# Patient Record
Sex: Male | Born: 1950
Health system: Southern US, Community
[De-identification: ages and names within clinical notes are randomized; demographics above are authoritative.]

## PROBLEM LIST (undated history)

## (undated) DIAGNOSIS — I4891 Unspecified atrial fibrillation: Secondary | ICD-10-CM

## (undated) DIAGNOSIS — R519 Headache, unspecified: Secondary | ICD-10-CM

## (undated) DIAGNOSIS — R51 Headache: Secondary | ICD-10-CM

## (undated) DIAGNOSIS — I639 Cerebral infarction, unspecified: Secondary | ICD-10-CM

## (undated) HISTORY — DX: Cerebral infarction, unspecified: I63.9

## (undated) HISTORY — PX: TONSILLECTOMY: SHX5217

## (undated) HISTORY — DX: Headache, unspecified: R51.9

## (undated) HISTORY — PX: FACIAL FRACTURE SURGERY: SHX1570

## (undated) HISTORY — DX: Unspecified atrial fibrillation: I48.91

## (undated) HISTORY — DX: Headache: R51

---

## 2001-05-25 ENCOUNTER — Encounter: Admission: RE | Admit: 2001-05-25 | Discharge: 2001-05-26 | Payer: Self-pay | Admitting: Anesthesiology

## 2002-05-22 ENCOUNTER — Encounter: Admission: RE | Admit: 2002-05-22 | Discharge: 2002-08-14 | Payer: Self-pay | Admitting: Family Medicine

## 2004-03-21 ENCOUNTER — Emergency Department (HOSPITAL_COMMUNITY): Admission: EM | Admit: 2004-03-21 | Discharge: 2004-03-21 | Payer: Self-pay | Admitting: Emergency Medicine

## 2004-03-21 IMAGING — CT CT ANGIO CHEST
4 of 7 series · 16 of 31 positions shown · IV contrast (omnipaque)
Comparison: none

CLINICAL DATA: 53-year-old male, chest pain and shortness of breath.   
CT ANGIOGRAM OF THE CHEST WITH CONTRAST ? PULMONARY EMBOLUS PROTOCOL
TECHNIQUE: Standard CTA protocol is performed with 120 cc Omnipaque 300 contrast.  Coronal and sagittal multiplanar reformatted imaging were included. 
No comparisons.
The heart size is normal.  No pericardial or pleural fluid.  There are prominent but nonenlarged axillary, mediastinal, and hilar lymph nodes.  The pulmonary arterial vasculature opacified demonstrates no evidence of definite filling defect to suggest thromboembolic disease to the chest.  Specifically, the pulmonary outflow tract, main pulmonary arteries, and segmental branches are patent.  There is limited visualization of the distal segmental and subsegmental branches because of the contrast bolus.  Incidental azygos lobe is noted in the right upper chest, normal variant.  Mucous secretions are thought to be present along the trachea on the right side, images 17 through 25.  
Lung windows demonstrate two subcentimeter noncalcified nodules in the left lower lobe along the fissure. These warrant followup in 3 to 6 months.  Ill-defined patchy density is evident in the lingula and left lower lobe consistent with atelectasis and/or developing airspace disease.  Early pneumonia is not excluded.  Dependent atelectasis is evident in the right lower lobe.  No lobar consolidation.  
IMPRESSION
No definite evidence of pulmonary embolus by CT criteria with limited visualization in the distal branches as described because of contrast bolus. 
Prominent mediastinal lymph nodes but not definitely enlarged by CT criteria.  
Left lower lobe pulmonary nodules warrant followup in 3 to 6 months. 
Lingula and left lower lobe patchy ill-defined density consistent with atelectasis and/or airspace disease. 
Right lower lobe atelectasis.

[Series 4: pe w/ lower ext · axial · 0.70mm/px · z∈[-290,-88]mm · 8 of 218 slices shown]
[im 28/218  lung]
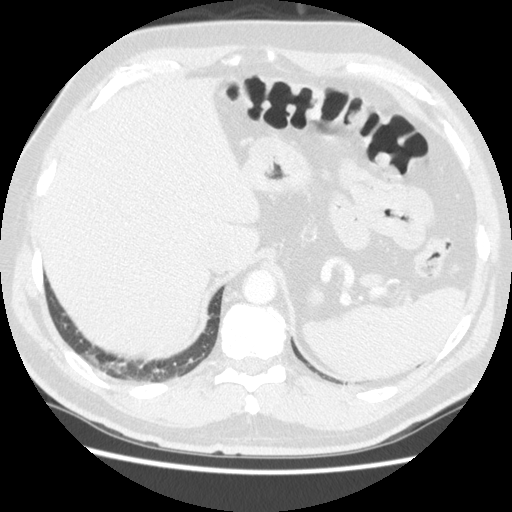
[im 55/218  mediastinal]
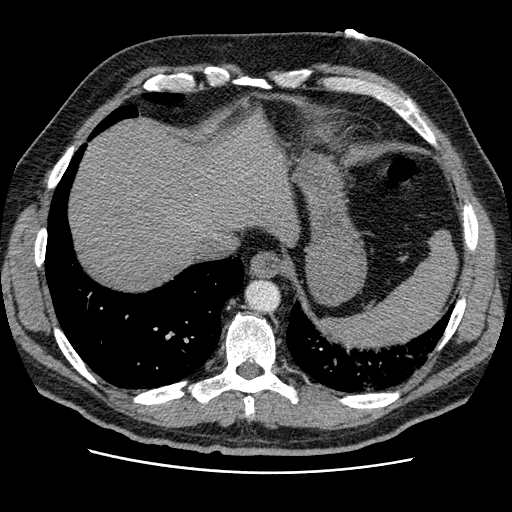
[im 82/218  lung]
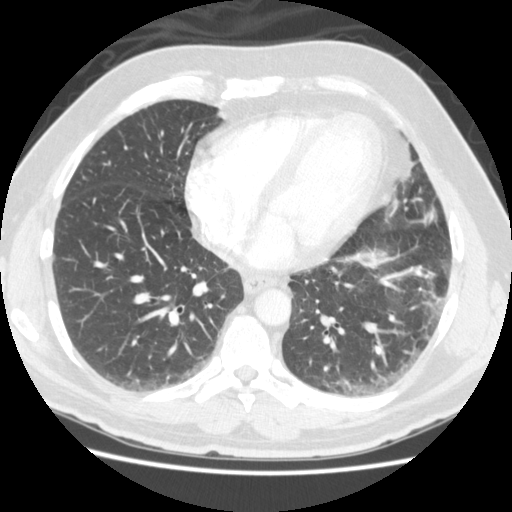
[im 109/218  mediastinal]
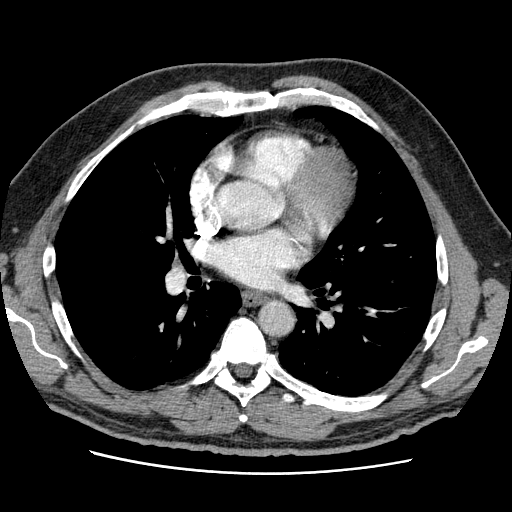
[im 120/218  lung]
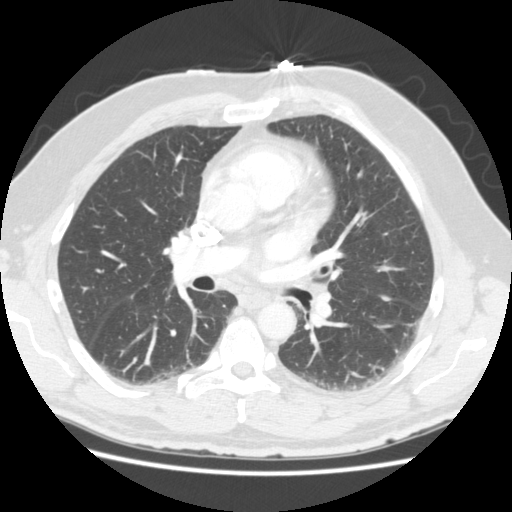
[im 136/218  mediastinal]
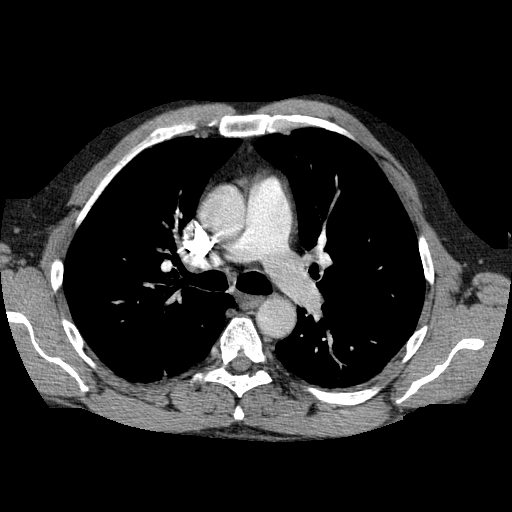
[im 163/218  lung]
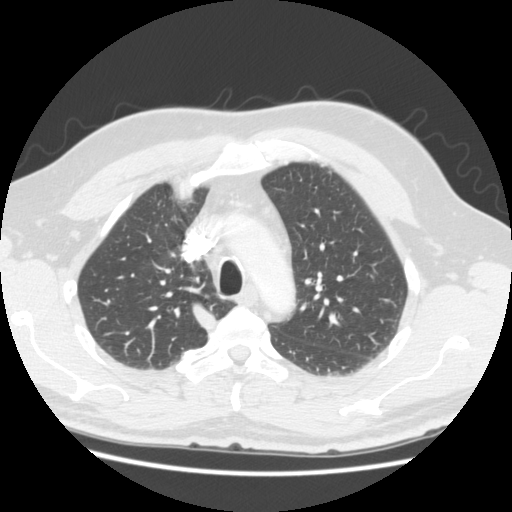
[im 190/218  mediastinal]
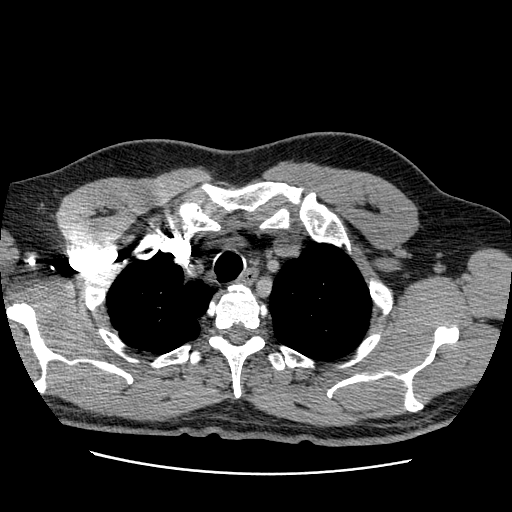

[Series 5: recon 2: pe w/ lower ext · axial · 0.70mm/px · z∈[-233,-143]mm · 3 of 109 slices shown]
[im 37/109  lung]
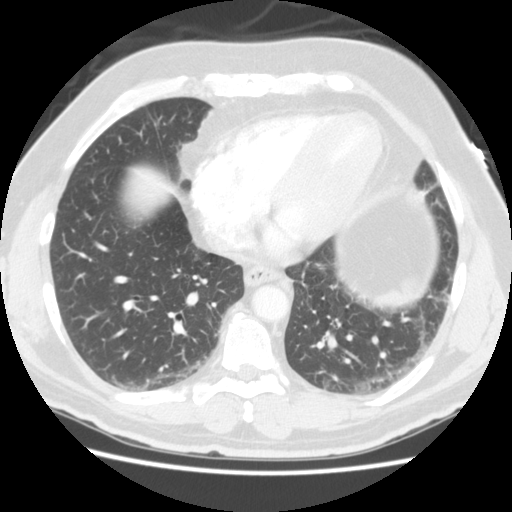
[im 60/109  lung]
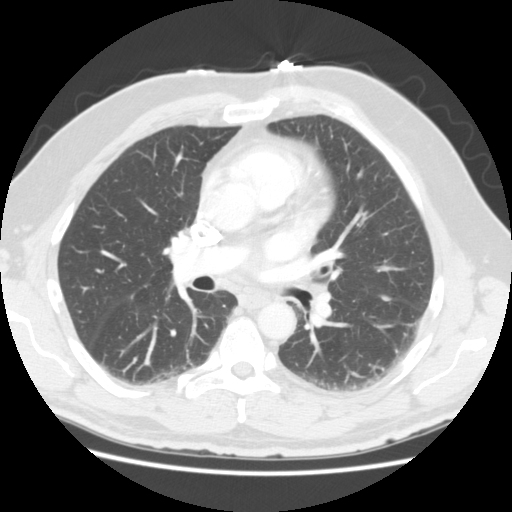
[im 73/109  lung]
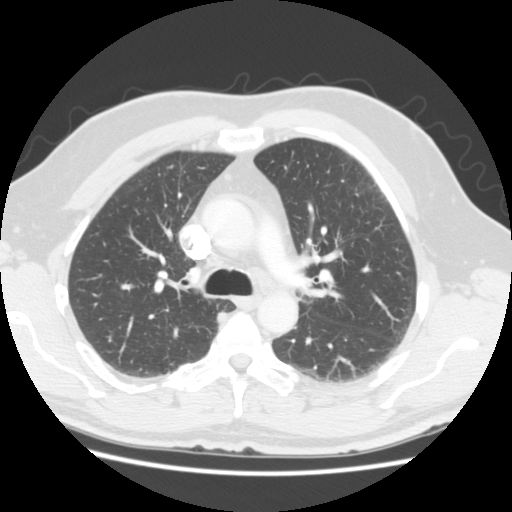

[Series 400: reformatted · sagittal · 0.70mm/px · 3 of 101 slices shown (1 of 2)]
[im 26/101  lung]
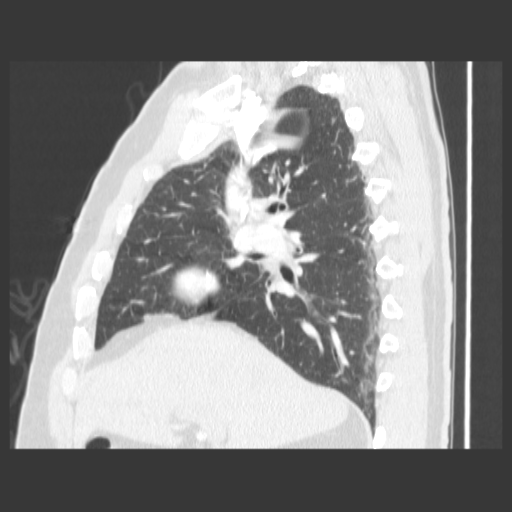
[im 51/101  lung]
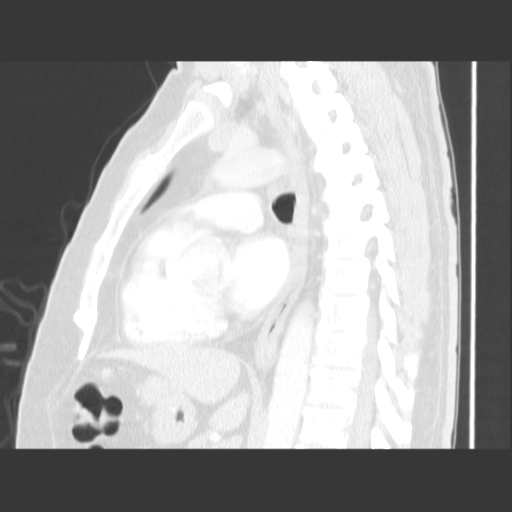
[im 76/101  lung]
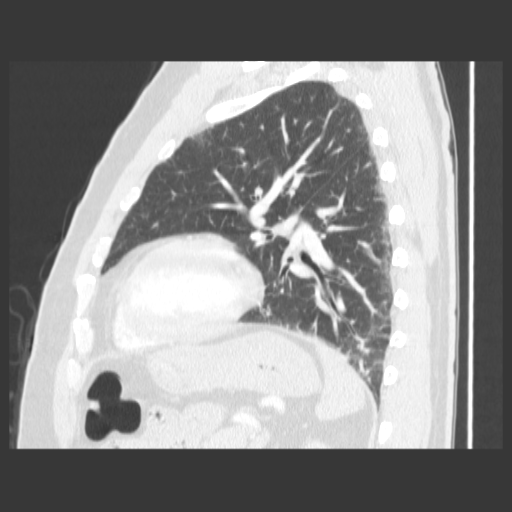

[Series 401: reformatted · coronal · 0.70mm/px · 2 of 80 slices shown (2 of 2)]
[im 27/80  lung]
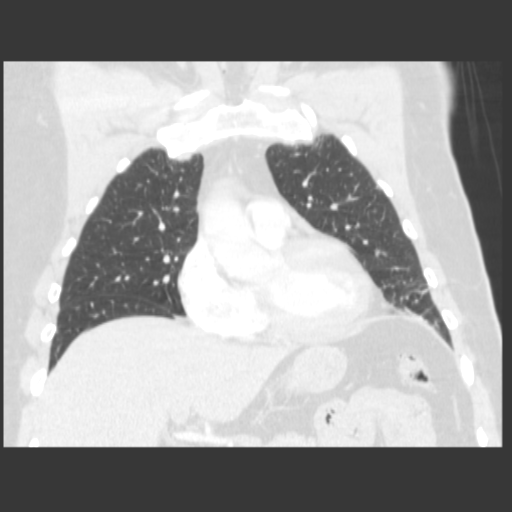
[im 53/80  lung]
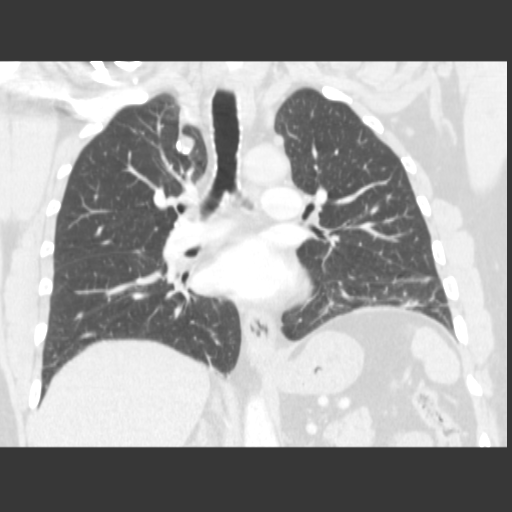

[16 of 31 positions shown; findings below may reference images not displayed]

## 2007-03-30 ENCOUNTER — Emergency Department (HOSPITAL_COMMUNITY): Admission: EM | Admit: 2007-03-30 | Discharge: 2007-03-30 | Payer: Self-pay | Admitting: Emergency Medicine

## 2007-03-30 IMAGING — CT CT HEAD W/O CM
3 of 4 series · 16 of 47 positions shown, 19 images · IV contrast (agent unspecified)
Comparison: none

CLINICAL DATA: Headache for a month.  Sinus pressure and drainage.
 HEAD CT WITHOUT CONTRAST:
TECHNIQUE: Contiguous axial images were obtained from the base of the skull through the vertex according to standard protocol without contrast.
TECHNIQUE: Coronal and axial CT images were obtained through the maxillofacial region including the facial bones, orbits, and paranasal sinuses.  No intravenous contrast was administered.

[Series 6: facial 2.0 h30s st · axial · 0.37mm/px · z∈[-282,-124]mm · 10 of 89 slices shown, 13 images]
[im 5/89  brain]
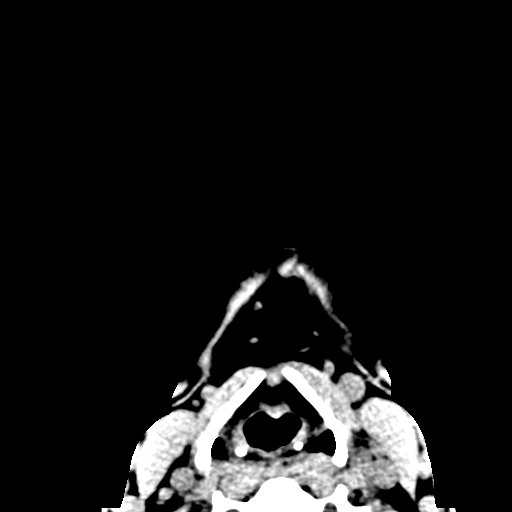
[im 5/89  bone]
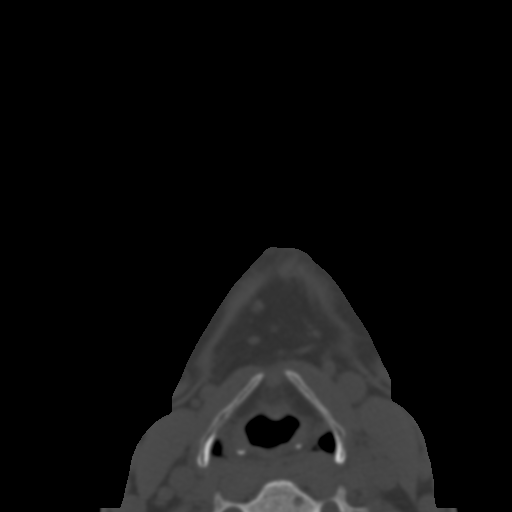
[im 14/89  brain]
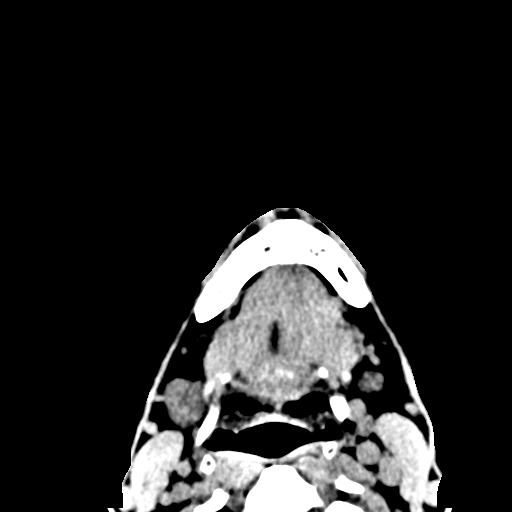
[im 23/89  brain]
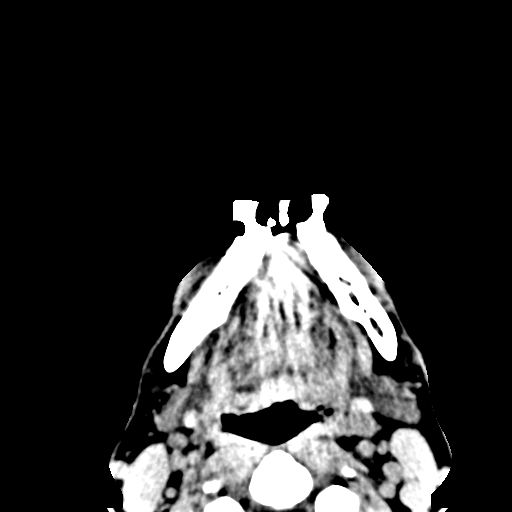
[im 31/89  brain]
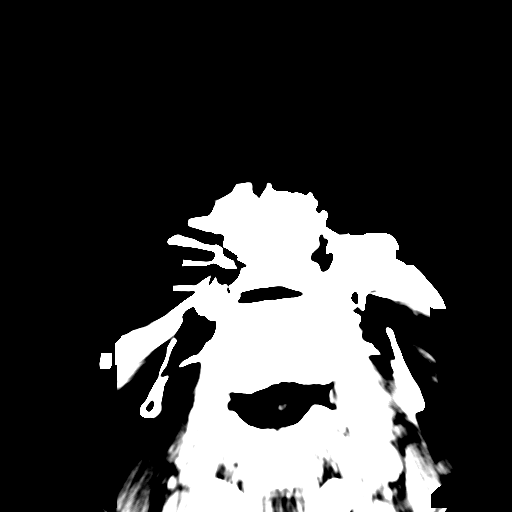
[im 40/89  brain]
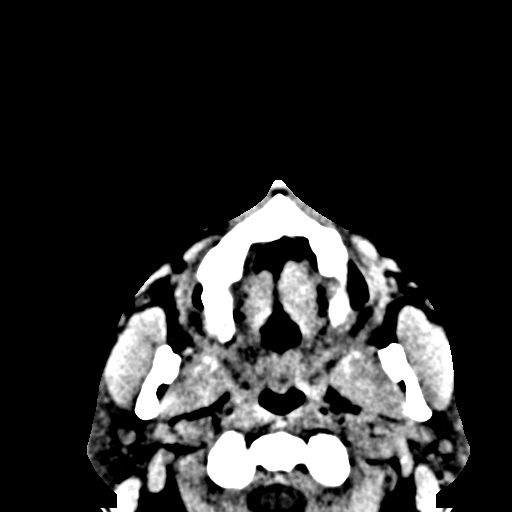
[im 40/89  bone]
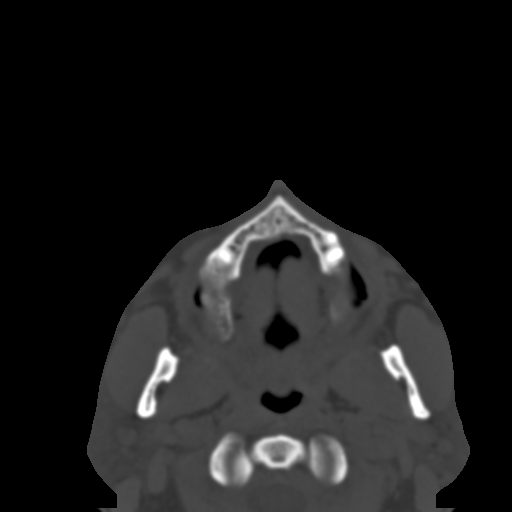
[im 49/89  brain]
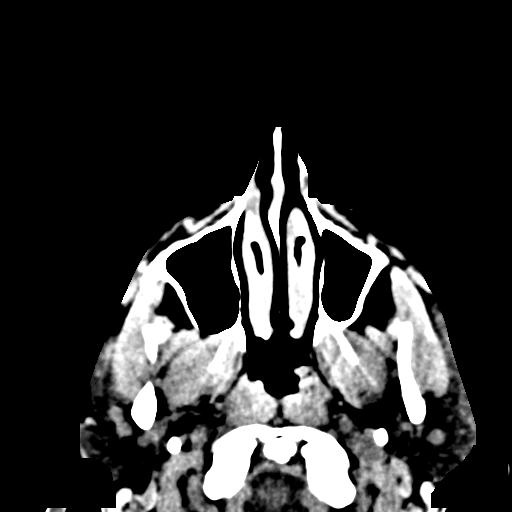
[im 58/89  brain]
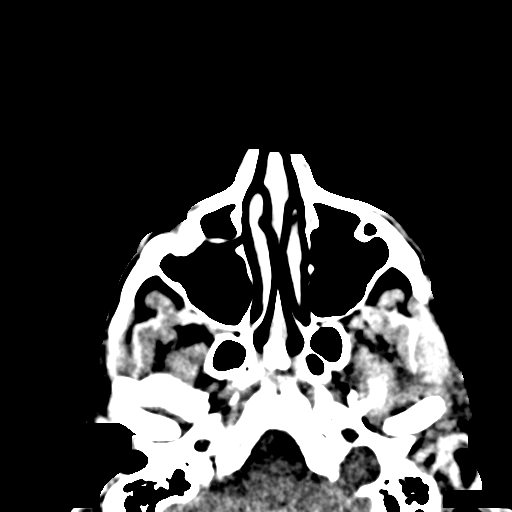
[im 67/89  brain]
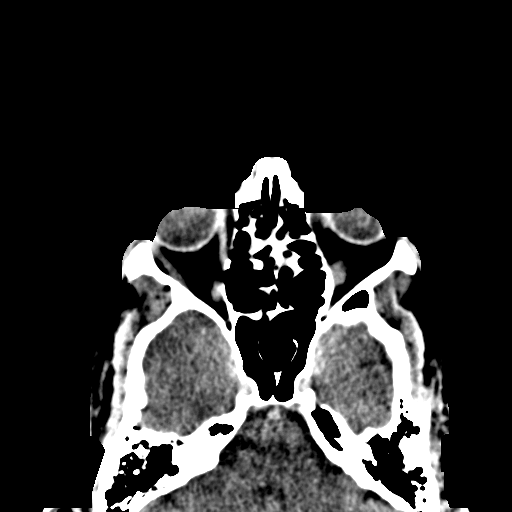
[im 75/89  brain]
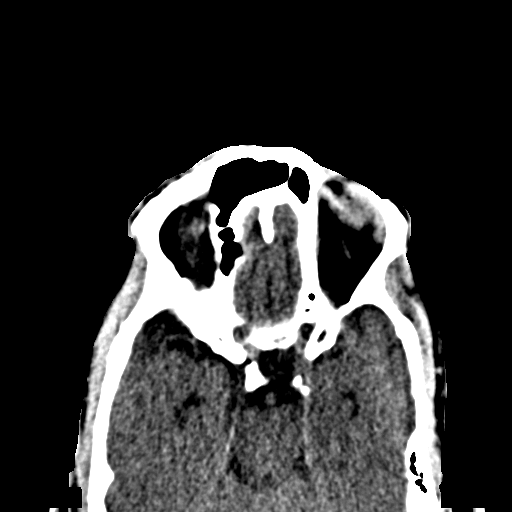
[im 75/89  bone]
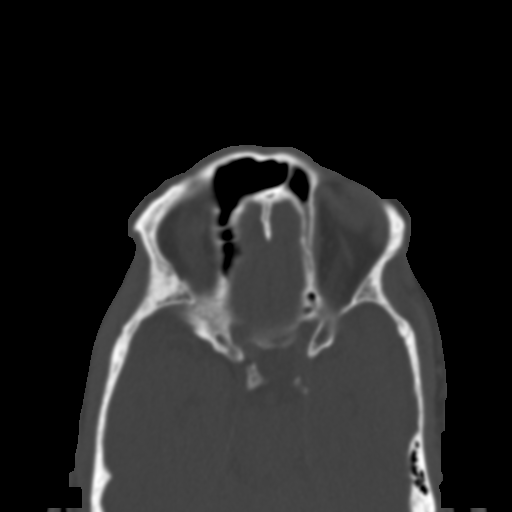
[im 84/89  brain]
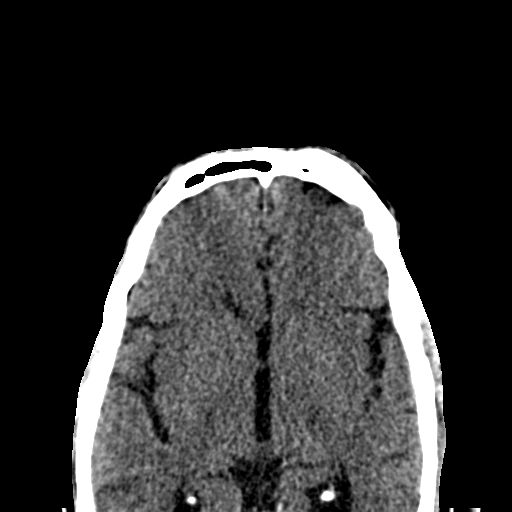

[Series 7: facial 2.0 spo · coronal · 0.39mm/px · 3 of 66 slices shown]
[im 22/66  brain]
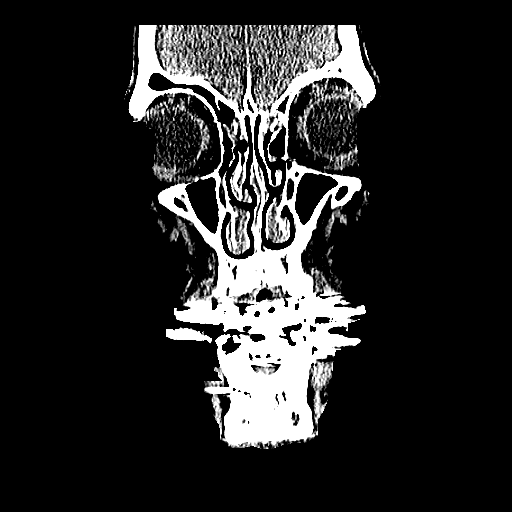
[im 29/66  brain]
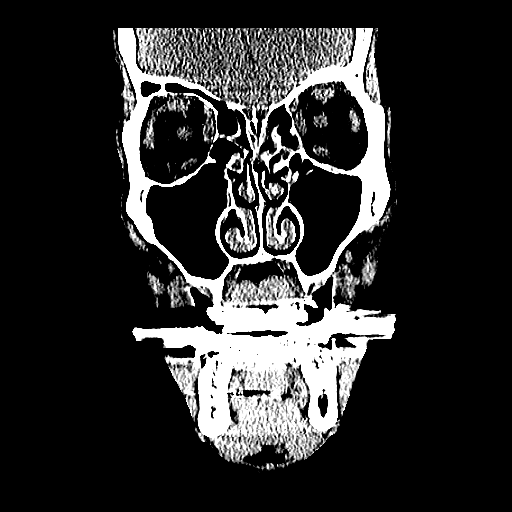
[im 37/66  brain]
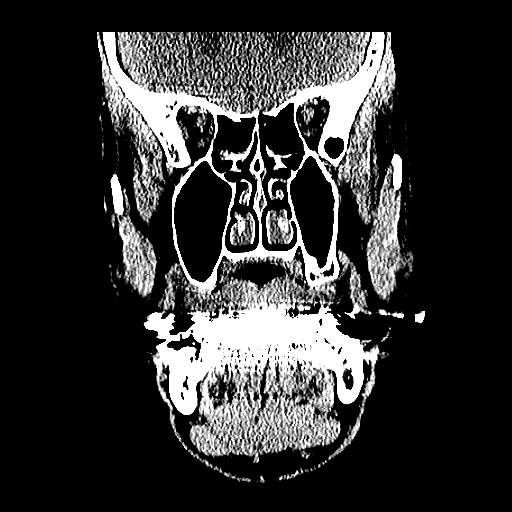

[Series 8: facial 2.0 spo sag sag sag · sagittal · 0.39mm/px · 3 of 80 slices shown]
[im 27/80  brain]
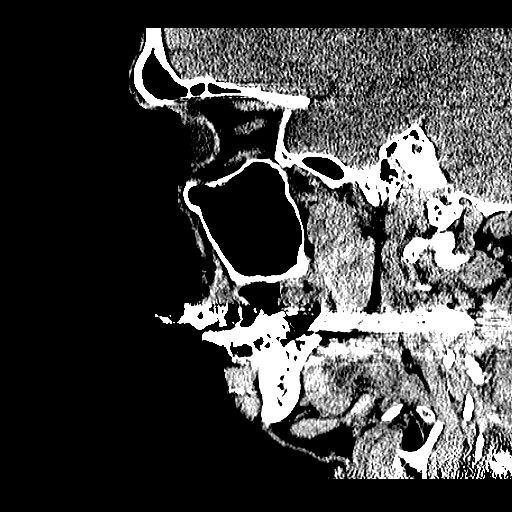
[im 40/80  brain]
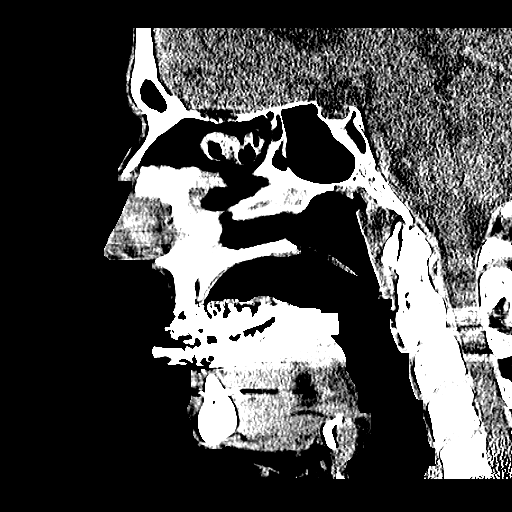
[im 53/80  brain]
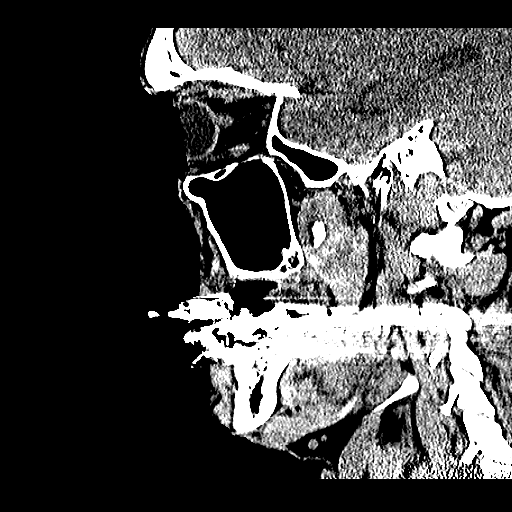

[16 of 47 positions shown; findings below may reference images not displayed]

FINDINGS: The brain has a normal appearance without evidence of atrophy, stroke, mass, hemorrhage, hydrocephalus, or extra-axial collection.  The calvarium is unremarkable.
IMPRESSION: Negative head CT.
 MAXILLOFACIAL CT WITHOUT CONTRAST:
FINDINGS: The maxillary sinuses are clear.  A few of the ethmoid air cells are opacified but this is not widespread.  The sphenoid sinus is clear.  The frontal sinuses are clear.  No fluid in the mastoids or middle ears. The other facial bones appear unremarkable.
IMPRESSION: Very minimal inflammatory change in the ethmoid region. This could be subclinical.  Certainly this is not the picture of pronounced sinusitis.

## 2007-03-30 IMAGING — CR DG CHEST 2V
2 series · 2 of 2 positions shown · non-contrast
Comparison: None

CLINICAL DATA: Headache, atrial fib

CHEST - 2 VIEW:

[w chest pa]
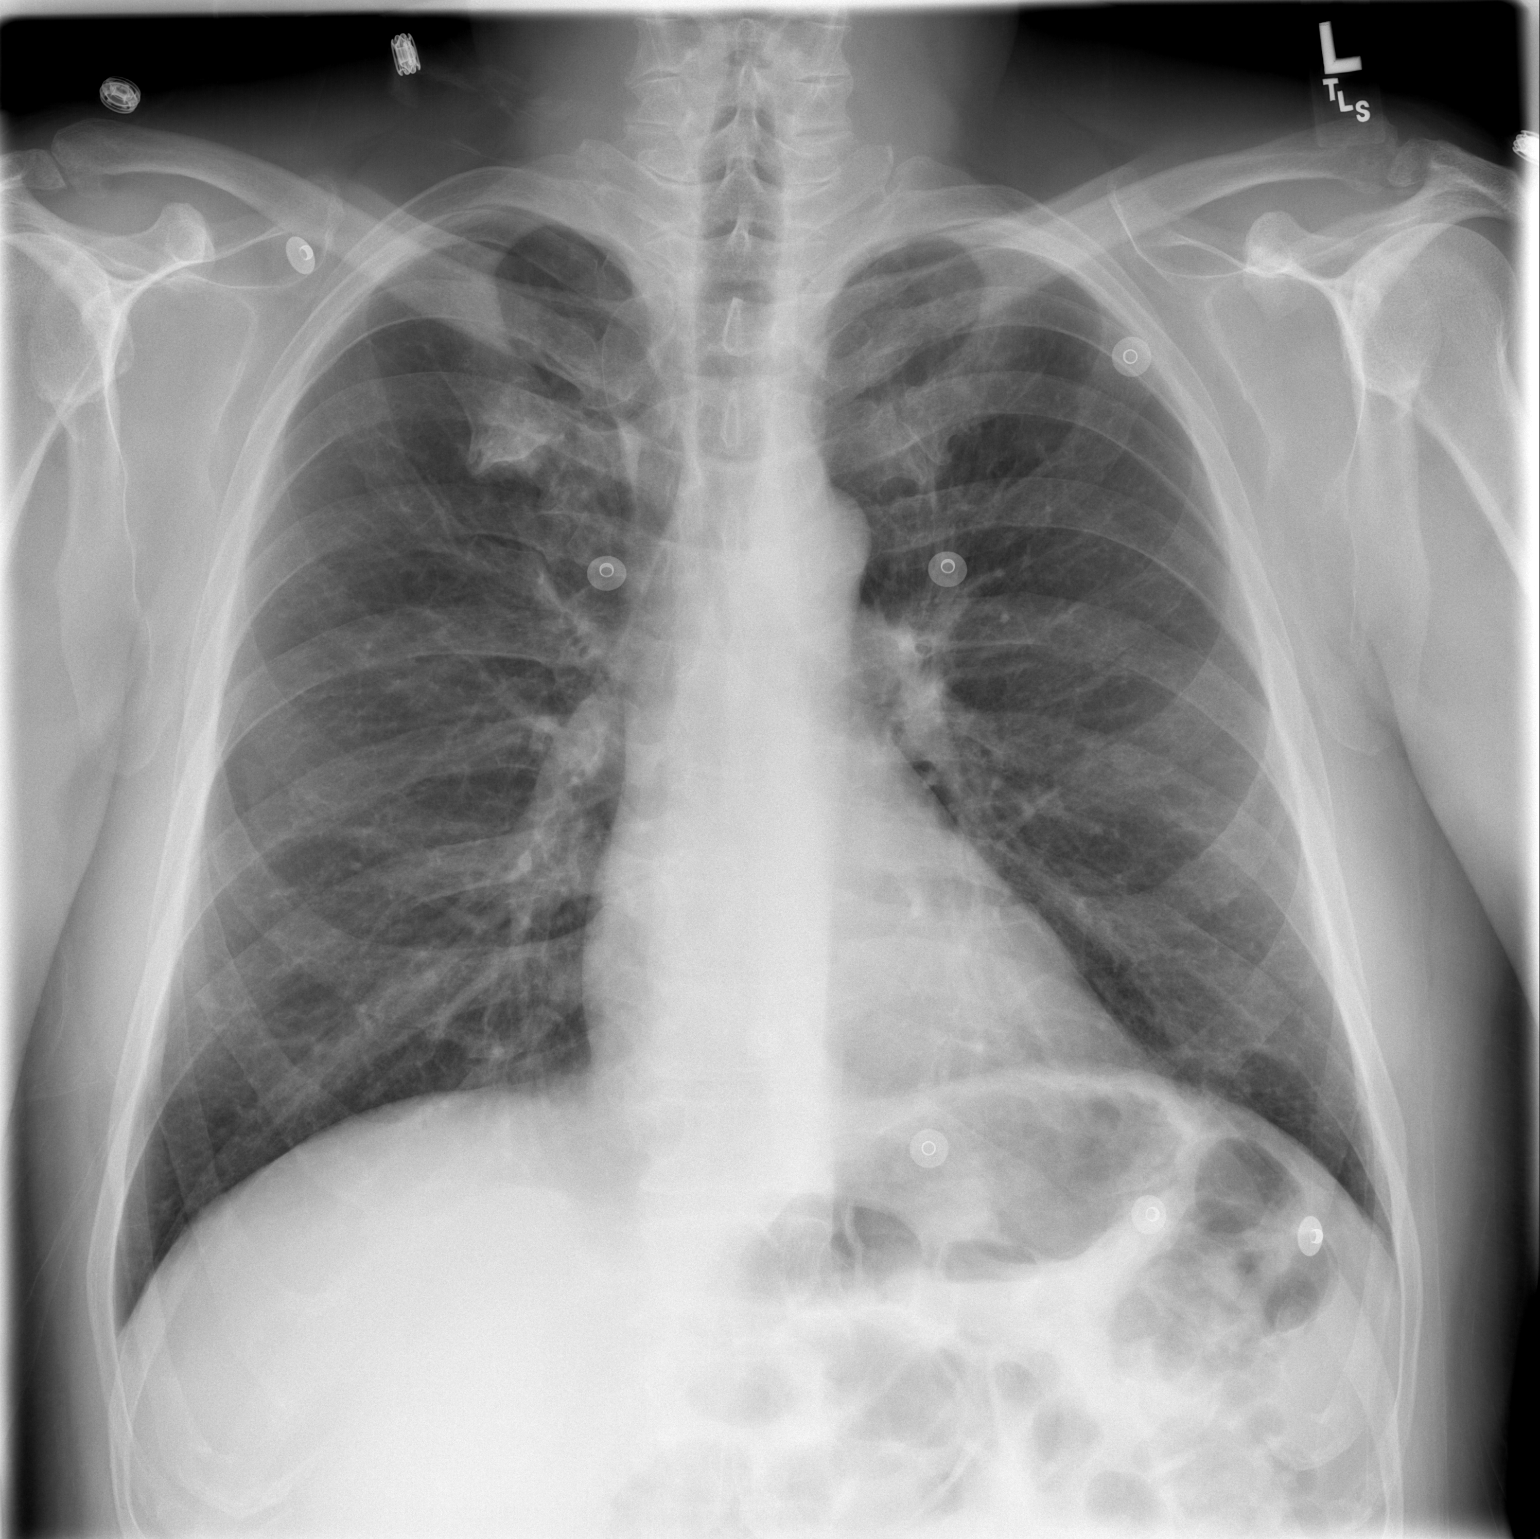

[w chest lat]
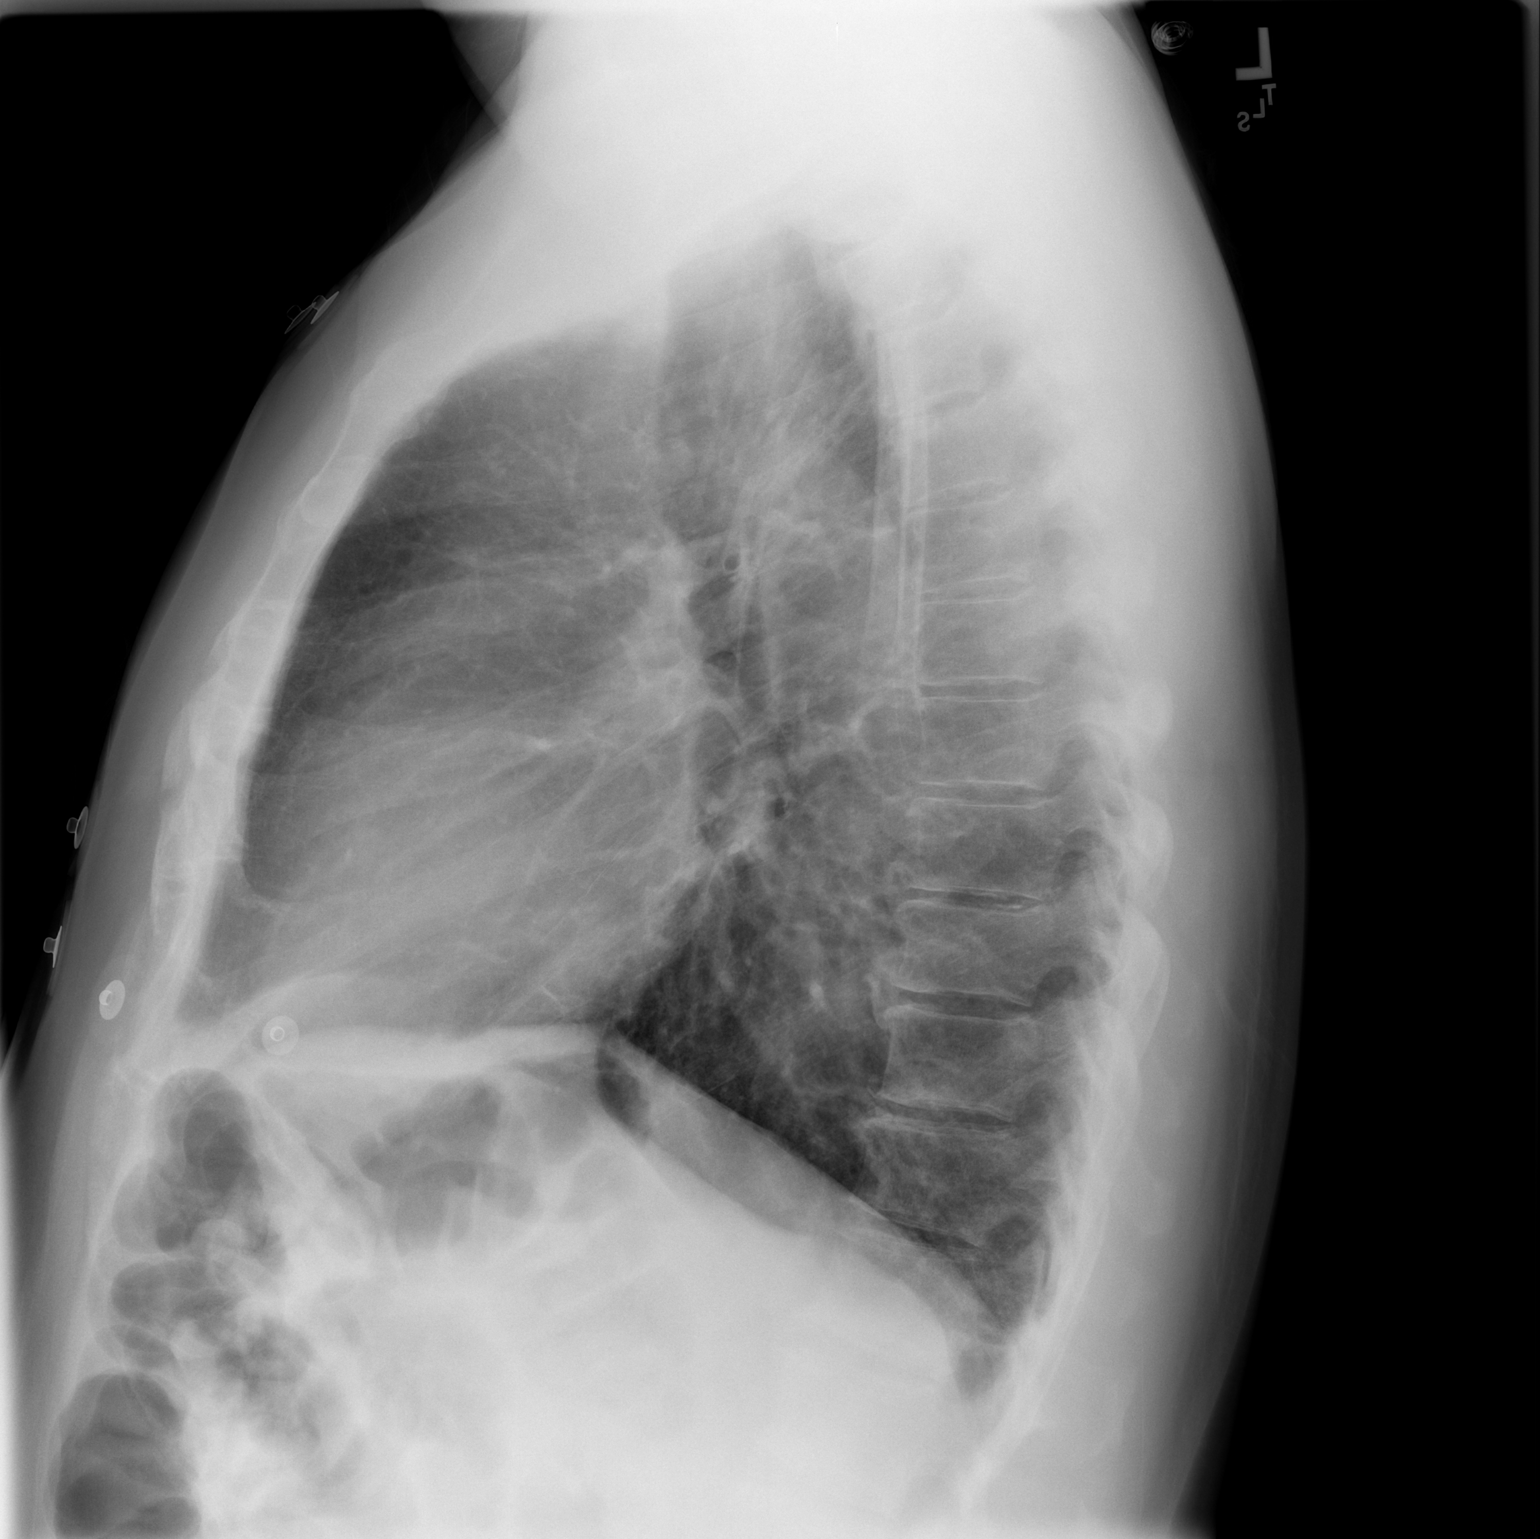

[2 of 2 positions shown; findings below may reference images not displayed]

FINDINGS: Heart and mediastinal contours are within normal limits. There is
peribronchial thickening. No focal airspace opacities or effusions. Degenerative
changes in the thoracic spine.
IMPRESSION: Mild bronchitic changes.

## 2007-06-08 ENCOUNTER — Ambulatory Visit (HOSPITAL_BASED_OUTPATIENT_CLINIC_OR_DEPARTMENT_OTHER): Admission: RE | Admit: 2007-06-08 | Discharge: 2007-06-08 | Payer: Self-pay | Admitting: *Deleted

## 2007-06-10 ENCOUNTER — Ambulatory Visit: Payer: Self-pay | Admitting: Internal Medicine

## 2010-11-19 ENCOUNTER — Ambulatory Visit: Payer: Self-pay | Admitting: Cardiology
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
## 2011-03-19 NOTE — Procedures (Signed)
[facility]  Patient:    Cody Whitney, Cody Whitney                         MRN: [id number] Proc. Date: 05/26/01 Adm. Date:  [id number] Attending:  Thyra Breed CC:         Candy Sledge, M.D.  Vikki Ports, M.D.   Procedure Report  PROCEDURE:  Lateral femoral cutaneous nerve block.  DIAGNOSIS:  Meralgia paresthetica.  HISTORY OF PRESENT ILLNESS:  Cody Whitney is a 60 year old who presents with a three to four year history of burning dysesthesias over the lateral aspect of his left calf. He was initially seen by a P.A. who advised him there was little that could be done for this and that he should learn to live with it. It has intensified to the point that it is bothering him while standing especially during presentations at work. He was recently evaluated by Dr. Noreene Filbert on June 14 at which time he was diagnosed as having meralgia paresthetica. He is sent for a series of lateral femoral cutaneous nerve blocks. The patient describes his pain as a numbing type sensation which is exacerbated by standing. He could not identify what made him feel better. There is no weakness or bowel or bladder incontinence. It is localized to the lateral aspect of the left thigh.  CURRENT MEDICATIONS:  Enteric coated aspirin.  ALLERGIES:  Poison Ivy and rhus plants as well as bee stings.  FAMILY HISTORY:  Unknown as he is adopted.  ACTIVE MEDICAL PROBLEMS:  Osteoarthritis of his feet.  PAST SURGICAL HISTORY:  Significant for tonsillectomy.  SOCIAL HISTORY:  The patients a pack per week smoker. He occasionally drinks alcohol. 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  REVIEW OF SYSTEMS:  GENERAL:  Negative. HEAD:  Negative. EYES:  Negative. NOSE/MOUTH/THROAT:  Negative. EARS:  Negative. PULMONARY:  Negative. CARDIOVASCULAR:  Negative. GI:  Negative.  MUSCULOSKELETAL:  See active medical problems. GU:  Negative. NEUROLOGIC:  See  HPI. HEMATOLOGIC:  Negative. CUTANEOUS:  History of dry skin in the winter time over his legs. ENDOCRINE: Negative. PSYCHIATRIC:  Negative. ALLERGY/IMMUNOLOGIC:  Positive for allergies as mentioned above.  PHYSICAL EXAMINATION:  VITAL SIGNS:  Blood pressure 133/76, heart rate 75, respiratory rate 18, O2 saturations 96%, pain level is 4/10.  GENERAL:  This is a pleasant male in no acute distress.  HEENT:  Normocephalic, atraumatic. Eyes extraocular movements intact with conjunctivae and sclerae clear. Nose patent nares. Oropharynx was free of lesions.  NECK:  Supple without lymphadenopathy. Carotids were 2+ and symmetric without bruits.  LUNGS:  Clear.  HEART:  Regular rate and rhythm without murmur, gallop or rub.  ABDOMEN:  Bowel sounds present.  GENITALIA/RECTAL:  Not performed.  BACK:  Revealed negative straight leg raise signs with intact gait.  EXTREMITIES:  No cyanosis, clubbing or edema with radial pulses and dorsalis pedis pulse 2+ and symmetric. The patient had bony enlargement of the first MTPs of his feet.  NEUROLOGIC:  The patient was oriented to person, place, time and reason for visit. Cranial nerves II-XII are grossly intact. Deep tendon reflexes were symmetric in the upper and lower extremities with down going toes. Motor was 5/5 with symmetric bulk and tone. Coordination was grossly intact. Sensory exam revealed attenuated pinprick over the distal left anterolateral thigh. Vibratory sense and pinprick was otherwise intact. Coordination was intact.  IMPRESSION:  Left lateral thigh numbness and tingling exacerbated  by standing with diagnosis consistent with meralgia paresthetica.  DISPOSITION:  I discussed treatment with nerve blocks versus medical treatment and he wishes to proceed with nerve blocks. I advised him that we may have to add in some low doses of tricyclic antidepressants in order to hold the nerve blocks.  DESCRIPTION OF PROCEDURE:  After  informed consent was obtained, the patient was placed in supine position and monitored. I identified the anterior superior iliac spine. One centimeter distal and one centimeter medial, a mark was made on the skin. The area was prepped with Betadine x 3. A skin wheel was raised with a 27 gauge needle using 1% lidocaine. A 25 gauge spinal needle was introduced to approximate the lateral foraminal cutaneous nerve and I injected 6 ml of local anesthetic which consisted of 1% lidocaine mixed with 0.5% levobupivacaine in a 1:1 ratio. The needle was removed intact.  Fifteen minutes later, the patient had good anesthesia over the lateral femoral cutaneous nerve distribution with resolution in his pain.  DISPOSITION: 1. Resume previous diet. 2. Limitations in activities per instruction sheet. 3. Continue on aspirin. 4. Followup with me in one to two weeks to consider repeat injection and/or    initiating low doses of tricyclic antidepressants. DD:  05/26/01 TD:  05/26/01 Job: [id number]

## 2011-03-19 NOTE — H&P (Signed)
[facility]  Patient:    Cody Whitney, Cody Whitney                    MRN: [id number] Adm. Date:  [id number] Disc. Date: [id number] Attending:  Thyra Breed CC:         Candy Sledge, M.D.  Vikki Ports, M.D.   History and Physical  [name] comes in for a followup evaluation and potentially repeat injection. He has minimal discomfort today.  He rates his pain at 1 to 2/10.  He has only had a couple of moments where he had any discomfort.  He is able to stand without the pain radiating out as much.  PHYSICAL EXAMINATION:  VITAL SIGNS:  Blood pressure 138/75, heart rate 59, respiratory rate 16, O2 saturations 96%, pain level 1/10.  IMPRESSION:  Miralgia paresthetica, markedly improved after one injection.  DISPOSITION:  I advised the patient to return to see [place] on an as needed basis. DD:  06/09/01 TD:  06/09/01 Job: [id number]

## 2011-10-01 ENCOUNTER — Telehealth: Payer: Self-pay | Admitting: *Deleted

## 2011-10-01 DIAGNOSIS — Z0000 Encounter for general adult medical examination without abnormal findings: Secondary | ICD-10-CM

## 2011-10-01 NOTE — Telephone Encounter (Signed)
Yearly lab order requested,

## 2011-11-03 ENCOUNTER — Other Ambulatory Visit: Payer: Self-pay | Admitting: *Deleted

## 2011-11-05 ENCOUNTER — Other Ambulatory Visit (INDEPENDENT_AMBULATORY_CARE_PROVIDER_SITE_OTHER): Payer: 59 | Admitting: *Deleted

## 2011-11-05 ENCOUNTER — Other Ambulatory Visit: Payer: Self-pay | Admitting: Cardiovascular Disease

## 2011-11-05 DIAGNOSIS — Z0000 Encounter for general adult medical examination without abnormal findings: Secondary | ICD-10-CM

## 2011-11-05 LAB — BASIC METABOLIC PANEL
CO2: 28 mEq/L (ref 19–32)
Calcium: 9.2 mg/dL (ref 8.4–10.5)
Creatinine, Ser: 1.1 mg/dL (ref 0.4–1.5)
Glucose, Bld: 94 mg/dL (ref 70–99)

## 2011-11-05 LAB — HEPATIC FUNCTION PANEL
AST: 21 U/L (ref 0–37)
Albumin: 4.1 g/dL (ref 3.5–5.2)
Alkaline Phosphatase: 66 U/L (ref 39–117)
Bilirubin, Direct: 0 mg/dL (ref 0.0–0.3)
Total Bilirubin: 0.7 mg/dL (ref 0.3–1.2)
Total Protein: 7 g/dL (ref 6.0–8.3)

## 2011-11-05 LAB — LIPID PANEL
Cholesterol: 214 mg/dL — ABNORMAL HIGH (ref 0–200)
HDL: 37.9 mg/dL — ABNORMAL LOW (ref >39.00)
Triglycerides: 103 mg/dL (ref 0.0–149.0)

## 2011-11-05 LAB — LDL CHOLESTEROL, DIRECT: Direct LDL: 159.9 mg/dL

## 2011-11-10 ENCOUNTER — Ambulatory Visit (INDEPENDENT_AMBULATORY_CARE_PROVIDER_SITE_OTHER): Payer: 59 | Admitting: Cardiovascular Disease

## 2011-11-10 ENCOUNTER — Encounter: Payer: Self-pay | Admitting: Cardiovascular Disease

## 2011-11-10 VITALS — BP 126/72 | HR 91 | Ht 72.0 in | Wt 224.4 lb

## 2011-11-10 DIAGNOSIS — I4891 Unspecified atrial fibrillation: Secondary | ICD-10-CM

## 2011-11-10 DIAGNOSIS — I482 Chronic atrial fibrillation, unspecified: Secondary | ICD-10-CM | POA: Insufficient documentation

## 2011-11-10 HISTORY — DX: Unspecified atrial fibrillation: I48.91

## 2011-11-10 MED ORDER — METOPROLOL SUCCINATE ER 25 MG PO TB24: 25.0000 mg | ORAL_TABLET | Freq: Every day | ORAL | Status: DC

## 2011-11-10 NOTE — Assessment & Plan Note (Signed)
Cody Whitney is a 61 year old gentleman with a history of atrial fibrillation. He has a CHADS score of 0. He is on aspirin 325 mg a day. His heart rate is a little bit fast. We'll start him on Toprol-XL 25 mg a day to help regulate his heart rate. I'll see him again in the office in 6 months for followup visit.

## 2011-11-10 NOTE — Patient Instructions (Signed)
Your physician wants you to follow-up in: 6 months  You will receive a reminder letter in the mail two months in advance. If you don't receive a letter, please call our office to schedule the follow-up appointment.  Your physician has requested that you have an echocardiogram. Echocardiography is a painless test that uses sound waves to create images of your heart. It provides your doctor with information about the size and shape of your heart and how well your heart's chambers and valves are working. This procedure takes approximately one hour. There are no restrictions for this procedure.   Your physician has recommended you make the following change in your medication:   START TOPROL XL 25 MG DAILY FOR HEART RATE AND RHYTHM CONTROL, MAY LOWER BP SO CHECK BP WEEKLY OR CALL OFFICE WITH ANY QUESTIONS OR CONCERNS

## 2011-11-10 NOTE — Progress Notes (Signed)
    Cody Whitney Date of Birth  1951-01-26 [facility]     [place] Office  [address]    [address]   [address] [place], [place]  [address]    [place], [place]  [address] [phone]  Fax  [phone]  [phone]  Fax [phone]   History of Present Illness:  Cody Whitney is a 61 yo gentleman with a hx of A-fib.  He is a previous patient of Dr. Reyes Ivan and then Dr. Deborah Chalk. He walks regularly at work.  He has no limitations doing his yardwork.  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 He's not had any episodes of chest pain or shortness breath.  Current Outpatient Prescriptions  Medication Sig Dispense Refill  . aspirin 325 MG tablet Take 325 mg by mouth daily.      . Multiple Vitamins-Minerals (CENTRUM SILVER PO) Take by mouth daily.         Not on File  No past medical history on file.  Past Surgical History  Procedure Date  . Tonsillectomy     History  Smoking status  . Current Everyday Smoker  Smokeless tobacco  . Not on file    History  Alcohol Use No    No family history on file.  Reviw of Systems:  Reviewed in the HPI.  All other systems are negative.  Physical Exam: BP 126/72  Pulse 91  Ht 6' (1.829 m)  Wt 224 lb 6.4 oz (101.787 kg)  BMI 30.43 kg/m2 The patient is alert and oriented x 3.  The mood and affect are normal.   Skin: warm and dry.  Color is normal.    HEENT:   He is normocephalic atraumatic. No bruits. 2+ carotids. No JVD.  Lungs: Lungs are clear to auscultation.   Heart: Irregularly irregular. He has no significant murmur.  Abdomen: Good bowel sounds. He has no hepatosplenomegaly. His abdomen is nontender.  Extremities:  No clubbing cyanosis or edema.  Neuro:  Exam is nonfocal. Skin is normal.    ECG: Atrial fibrillation with a controlled ventricular response. He has nonspecific ST-T wave abnormalities.  Assessment / Plan:

## 2011-11-17 ENCOUNTER — Ambulatory Visit (HOSPITAL_COMMUNITY): Payer: 59 | Attending: Cardiovascular Disease

## 2011-11-17 DIAGNOSIS — F17200 Nicotine dependence, unspecified, uncomplicated: Secondary | ICD-10-CM | POA: Insufficient documentation

## 2011-11-17 DIAGNOSIS — I4891 Unspecified atrial fibrillation: Secondary | ICD-10-CM | POA: Insufficient documentation

## 2011-11-19 ENCOUNTER — Telehealth: Payer: Self-pay | Admitting: Cardiovascular Disease

## 2011-11-19 NOTE — Telephone Encounter (Signed)
Echo results given to pt. Mylo Red RN

## 2011-11-19 NOTE — Telephone Encounter (Signed)
New problem:  Returning nurse called from yesterday

## 2012-08-08 ENCOUNTER — Encounter: Payer: Self-pay | Admitting: *Deleted

## 2012-10-30 ENCOUNTER — Other Ambulatory Visit: Payer: Self-pay

## 2012-10-30 MED ORDER — METOPROLOL SUCCINATE ER 25 MG PO TB24: 25.0000 mg | ORAL_TABLET | Freq: Every day | ORAL | Status: DC

## 2012-10-31 ENCOUNTER — Telehealth: Payer: Self-pay | Admitting: Cardiovascular Disease

## 2012-10-31 MED ORDER — METOPROLOL SUCCINATE ER 25 MG PO TB24: 25.0000 mg | ORAL_TABLET | Freq: Every day | ORAL | Status: DC

## 2012-10-31 NOTE — Telephone Encounter (Signed)
Pt needs appointment then refill can be made Fax Received. Refill Completed. Cody Whitney (R.M.A)   

## 2012-10-31 NOTE — Telephone Encounter (Signed)
Wife called re refill on 10-30-12 called into wrong pharmacy, [facility] [address] [phone] for  Metoprolol

## 2013-01-09 ENCOUNTER — Other Ambulatory Visit: Payer: Self-pay | Admitting: *Deleted

## 2013-01-09 MED ORDER — METOPROLOL SUCCINATE ER 25 MG PO TB24: 25.0000 mg | ORAL_TABLET | Freq: Every day | ORAL | Status: DC

## 2013-01-09 NOTE — Telephone Encounter (Signed)
Fax Received. Refill Completed. Cody Whitney (R.M.A)   

## 2013-03-29 ENCOUNTER — Ambulatory Visit (INDEPENDENT_AMBULATORY_CARE_PROVIDER_SITE_OTHER): Payer: 59 | Admitting: Cardiovascular Disease

## 2013-03-29 ENCOUNTER — Encounter: Payer: Self-pay | Admitting: Cardiovascular Disease

## 2013-03-29 VITALS — BP 124/88 | HR 81 | Ht 72.0 in | Wt 225.0 lb

## 2013-03-29 DIAGNOSIS — I4891 Unspecified atrial fibrillation: Secondary | ICD-10-CM

## 2013-03-29 MED ORDER — METOPROLOL SUCCINATE ER 25 MG PO TB24: 25.0000 mg | ORAL_TABLET | Freq: Every day | ORAL | Status: DC

## 2013-03-29 NOTE — Progress Notes (Signed)
    Cody Whitney Date of Birth  03-03-1951 [facility]     [place] Office  [address]    [address]   [address] [place], [place]  [address]    [place], [place]  [address] [phone]  Fax  [phone]  [phone]  Fax [phone]   History of Present Illness:  Cody Whitney is a 62 yo gentleman with a hx of A-fib.  He is a previous patient of Dr. Reyes Ivan and then Dr. Deborah Chalk. He walks regularly at work.  He has no limitations doing his yardwork.  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 He's not had any episodes of chest pain or shortness breath.  Mar 29, 2013:  Cody Whitney is doing well.  He is busy taking care of his wife who has cancer.   He has not been exercising much because of his busy schedule  Current Outpatient Prescriptions  Medication Sig Dispense Refill  . aspirin 325 MG tablet Take 325 mg by mouth daily.      . Multiple Vitamins-Minerals (CENTRUM SILVER PO) Take by mouth daily.      . metoprolol succinate (TOPROL XL) 25 MG 24 hr tablet Take 1 tablet (25 mg total) by mouth daily.  30 tablet  3   No current facility-administered medications for this visit.     Allergies  Allergen Reactions  . Tylenol (Acetaminophen)     WITH CODEINE     Past Medical History  Diagnosis Date  . Atrial fibrillation 11/10/2011    Past Surgical History  Procedure Laterality Date  . Tonsillectomy      History  Smoking status  . Current Every Day Smoker  Smokeless tobacco  . Not on file    History  Alcohol Use No    No family history on file.  Reviw of Systems:  Reviewed in the HPI.  All other systems are negative.  Physical Exam: BP 124/88  Pulse 81  Ht 6' (1.829 m)  Wt 225 lb (102.059 kg)  BMI 30.51 kg/m2 The patient is alert and oriented x 3.  The mood and affect are normal.   Skin: warm and dry.  Color is normal.    HEENT:   He is normocephalic atraumatic. No bruits. 2+ carotids. No JVD.  Lungs: Lungs are clear to  auscultation.   Heart: Irregularly irregular. He has no significant murmur.  Abdomen: Good bowel sounds. He has no hepatosplenomegaly. His abdomen is nontender.  Extremities:  No clubbing cyanosis or edema.  Neuro:  Exam is nonfocal. Skin is normal.    ECG:  Mar 29, 2013:  Atrial fibrillation with a controlled ventricular response at 81.  Marland Kitchen He has nonspecific ST-T wave abnormalities.  Assessment / Plan:

## 2013-03-29 NOTE — Assessment & Plan Note (Signed)
Cody Whitney is doing well.  Continue with current medications.    His CHADS score is ~1 for mild intermittant HTN.  We may consider starting Xarelto ( or similar) in the future.  For now, he appears to be at low risk for thromboemboli.

## 2013-03-29 NOTE — Patient Instructions (Signed)
Your physician wants you to follow-up in: 1 year  You will receive a reminder letter in the mail two months in advance. If you don't receive a letter, please call our office to schedule the follow-up appointment.  Your physician recommends that you continue on your current medications as directed. Please refer to the Current Medication list given to you today.  

## 2013-04-18 ENCOUNTER — Telehealth: Payer: Self-pay | Admitting: Cardiovascular Disease

## 2013-04-18 MED ORDER — METOPROLOL SUCCINATE ER 25 MG PO TB24: 25.0000 mg | ORAL_TABLET | Freq: Every day | ORAL | Status: DC

## 2013-04-18 NOTE — Telephone Encounter (Signed)
METOPROLOL WAS SENT ONCE/ SENT AGAIN, PHARMACY SAID THEY DIDN'T RECEIVE IT

## 2013-04-18 NOTE — Telephone Encounter (Signed)
New Prob     Pt has some questions regarding his medication. Please call.

## 2013-09-05 ENCOUNTER — Telehealth: Payer: Self-pay | Admitting: Cardiovascular Disease

## 2013-09-05 NOTE — Telephone Encounter (Signed)
No form received today, will forward to [facility] RN so she will be looking for form.

## 2013-09-05 NOTE — Telephone Encounter (Signed)
New Problem:  Pt states he will be dropping off a one page document from his work today or tomorrow. Pt states he needs Dr. Elease Hashimoto to fill it out. The document simply states the pt is ok to take a lie detector test. Please call pt when it is filled out.

## 2013-09-05 NOTE — Telephone Encounter (Signed)
i do not see any cardiac issues that would prevent him from having a lie detector test.

## 2013-09-10 NOTE — Telephone Encounter (Signed)
NO FORM AS OF YET.

## 2013-09-17 ENCOUNTER — Telehealth: Payer: Self-pay | Admitting: *Deleted

## 2013-09-17 NOTE — Telephone Encounter (Signed)
Pt dropped off paper to sign for him to have a polygraph test done, paper was signed with no reasons pt could not complete one. Taken first to MR to be scanned.

## 2014-04-01 ENCOUNTER — Ambulatory Visit (INDEPENDENT_AMBULATORY_CARE_PROVIDER_SITE_OTHER): Payer: 59 | Admitting: Cardiovascular Disease

## 2014-04-01 ENCOUNTER — Encounter: Payer: Self-pay | Admitting: Cardiovascular Disease

## 2014-04-01 VITALS — BP 130/89 | HR 69 | Ht 72.0 in | Wt 220.0 lb

## 2014-04-01 DIAGNOSIS — I10 Essential (primary) hypertension: Secondary | ICD-10-CM | POA: Insufficient documentation

## 2014-04-01 DIAGNOSIS — I4891 Unspecified atrial fibrillation: Secondary | ICD-10-CM

## 2014-04-01 MED ORDER — METOPROLOL SUCCINATE ER 25 MG PO TB24: 25.0000 mg | ORAL_TABLET | Freq: Every day | ORAL | Status: DC

## 2014-04-01 NOTE — Patient Instructions (Signed)
Your physician recommends that you continue on your current medications as directed. Please refer to the Current Medication list given to you today.  Your physician wants you to follow-up in: 1 year with Dr. Nahser.  You will receive a reminder letter in the mail two months in advance. If you don't receive a letter, please call our office to schedule the follow-up appointment.  

## 2014-04-01 NOTE — Assessment & Plan Note (Signed)
He remains asymptomatic. He has a CHADS score of 1.  He would like to stay on aspirin and does not want to start a Novel anticoagulant  agent at this time.

## 2014-04-01 NOTE — Progress Notes (Signed)
      Cody Whitney Date of Birth  05-23-1951 [facility]      Office  [address]    [address]   [address] [place], [place]  [address]    [place], [place]  [address] [phone]  Fax      [phone]  Fax [phone]   History of Present Illness:  Cody Whitney is a 63 yo gentleman with a hx of A-fib.  He is a previous patient of Dr. Reyes Ivan and then Dr. Deborah Chalk. He walks regularly at work.  He has no limitations doing his yardwork.  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 He's not had any episodes of chest pain or shortness breath.  Mar 29, 2013:  Cody Whitney is doing well.  He is busy taking care of his wife who has cancer.   He has not been exercising much because of his busy schedule  April 01, 2014:  Cody Whitney is doing ok.    Current Outpatient Prescriptions  Medication Sig Dispense Refill  . aspirin 325 MG tablet Take 325 mg by mouth daily.      . chlorhexidine (PERIDEX) 0.12 % solution       . metoprolol succinate (TOPROL XL) 25 MG 24 hr tablet Take 1 tablet (25 mg total) by mouth daily.  90 tablet  3  . Multiple Vitamins-Minerals (CENTRUM SILVER PO) Take by mouth daily.       No current facility-administered medications for this visit.     Allergies  Allergen Reactions  . Tylenol [Acetaminophen]     WITH CODEINE     Past Medical History  Diagnosis Date  . Atrial fibrillation 11/10/2011    Past Surgical History  Procedure Laterality Date  . Tonsillectomy      History  Smoking status  . Current Every Day Smoker  Smokeless tobacco  . Not on file    History  Alcohol Use No    No family history on file.  Reviw of Systems:  Reviewed in the HPI.  All other systems are negative.  Physical Exam: BP 130/89  Pulse 69  Ht 6' (1.829 m)  Wt 220 lb (99.791 kg)  BMI 29.83 kg/m2 The patient is alert and oriented x 3.  The mood and affect are normal.   Skin: warm and dry.  Color is normal.    HEENT:   He is  normocephalic atraumatic. No bruits. 2+ carotids. No JVD.  Lungs: Lungs are clear to auscultation.   Heart: Irregularly irregular. He has no significant murmur.  Abdomen: Good bowel sounds. He has no hepatosplenomegaly. His abdomen is nontender.  Extremities:  No clubbing cyanosis or edema.  Neuro:  Exam is nonfocal. Skin is normal.    ECG:  Mar 29, 2013:  Atrial fibrillation with a controlled ventricular response at 81.  Marland Kitchen He has nonspecific ST-T wave abnormalities.  Assessment / Plan:

## 2014-04-01 NOTE — Assessment & Plan Note (Signed)
Sill mildly elevated.   Encouraged him to exercise.

## 2014-08-16 ENCOUNTER — Other Ambulatory Visit: Payer: Self-pay

## 2015-03-06 ENCOUNTER — Ambulatory Visit (INDEPENDENT_AMBULATORY_CARE_PROVIDER_SITE_OTHER): Payer: 59 | Admitting: Cardiovascular Disease

## 2015-03-06 ENCOUNTER — Encounter: Payer: Self-pay | Admitting: Cardiovascular Disease

## 2015-03-06 VITALS — BP 110/74 | HR 93 | Ht 72.0 in | Wt 224.4 lb

## 2015-03-06 DIAGNOSIS — I10 Essential (primary) hypertension: Secondary | ICD-10-CM

## 2015-03-06 DIAGNOSIS — I4891 Unspecified atrial fibrillation: Secondary | ICD-10-CM | POA: Diagnosis not present

## 2015-03-06 NOTE — Patient Instructions (Signed)
Medication Instructions:  Your physician recommends that you continue on your current medications as directed. Please refer to the Current Medication list given to you today.   Labwork: None  Testing/Procedures: None  Follow-Up: Your physician wants you to follow-up in: 1 year with Dr. Nahser.  You will receive a reminder letter in the mail two months in advance. If you don't receive a letter, please call our office to schedule the follow-up appointment.      

## 2015-03-06 NOTE — Progress Notes (Signed)
Cardiology Office Note   Date:  03/06/2015   ID:  Cody Whitney, DOB 06/04/1951, MRN [id number]  PCP:  Mickie HillierLITTLE,KEVIN LORNE, MD  Cardiologist:   Vesta MixerNahser, Philip J, MD   Chief Complaint  Patient presents with  . Atrial Fibrillation   Cody Whitney is a 64 yo gentleman with a hx of A-fib. He is a previous patient of Dr. Reyes IvanKersey and then Dr. Deborah Chalkennant. He walks regularly at work. He has no limitations doing his yardwork. 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 He's not had any episodes of chest pain or shortness breath.  Mar 29, 2013:  Cody Whitney is doing well. He is busy taking care of his wife who has cancer. He has not been exercising much because of his busy schedule  April 01, 2014:  Cody Whitney is doing ok.    History of Present Illness: Cody Whitney is a 64 y.o. male who presents for further eval of his atrial fib.  He is 64 years old and is on aspirin alone. He's on metoprolol for his rate control. He remains completely asymptomatic. He's able to do all of his normal activities without any significant problems   Past Medical History  Diagnosis Date  . Atrial fibrillation 11/10/2011    Past Surgical History  Procedure Laterality Date  . Tonsillectomy       Current Outpatient Prescriptions  Medication Sig Dispense Refill  . aspirin 325 MG tablet Take 325 mg by mouth daily.    . chlorhexidine (PERIDEX) 0.12 % solution Use as directed 5 mLs in the mouth or throat 2 (two) times daily.     . metoprolol succinate (TOPROL XL) 25 MG 24 hr tablet Take 1 tablet (25 mg total) by mouth daily. 90 tablet 3  . Multiple Vitamins-Minerals (CENTRUM SILVER PO) Take by mouth daily.     No current facility-administered medications for this visit.    Allergies:   Tylenol    Social History:  The patient  reports that he has been smoking.  He does not have any smokeless tobacco history on file. He reports that he does not drink alcohol.   Family History:  The patient's  family history includes Atrial fibrillation in his sister. He was adopted.    ROS:  Please see the history of present illness.    Review of Systems: Constitutional:  denies fever, chills, diaphoresis, appetite change and fatigue.  HEENT: denies photophobia, eye pain, redness, hearing loss, ear pain, congestion, sore throat, rhinorrhea, sneezing, neck pain, neck stiffness and tinnitus.  Respiratory: denies SOB, DOE, cough, chest tightness, and wheezing.  Cardiovascular: denies chest pain, palpitations and leg swelling.  Gastrointestinal: denies nausea, vomiting, abdominal pain, diarrhea, constipation, blood in stool.  Genitourinary: denies dysuria, urgency, frequency, hematuria, flank pain and difficulty urinating.  Musculoskeletal: denies  myalgias, back pain, joint swelling, arthralgias and gait problem.   Skin: denies pallor, rash and wound.  Neurological: denies dizziness, seizures, syncope, weakness, light-headedness, numbness and headaches.   Hematological: denies adenopathy, easy bruising, personal or family bleeding history.  Psychiatric/ Behavioral: denies suicidal ideation, mood changes, confusion, nervousness, sleep disturbance and agitation.       All other systems are reviewed and negative.    PHYSICAL EXAM: VS:  BP 110/74 mmHg  Pulse 93  Ht 6' (1.829 m)  Wt 224 lb 6.4 oz (101.787 kg)  BMI 30.43 kg/m2 , BMI Body mass index is 30.43 kg/(m^2). GEN: Well nourished, well developed, in no acute distress HEENT: normal Neck:  no JVD, carotid bruits, or masses Cardiac: Irreg. Irreg. ; no murmurs, rubs, or gallops,no edema  Respiratory:  clear to auscultation bilaterally, normal work of breathing GI: soft, nontender, nondistended, + BS MS: no deformity or atrophy Skin: warm and dry, no rash Neuro:  Strength and sensation are intact Psych: normal   EKG:  EKG is ordered today. The ekg ordered today demonstrates :  Atrial fib at 93.    Recent Labs: No results found for  requested labs within last 365 days.    Lipid Panel    Component Value Date/Time   CHOL 214* 11/05/2011 0856   TRIG 103.0 11/05/2011 0856   HDL 37.90* 11/05/2011 0856   CHOLHDL 6 11/05/2011 0856   VLDL 20.6 11/05/2011 0856   LDLDIRECT 159.9 11/05/2011 0856      Wt Readings from Last 3 Encounters:  03/06/15 224 lb 6.4 oz (101.787 kg)  04/01/14 220 lb (99.791 kg)  03/29/13 225 lb (102.059 kg)      Other studies Reviewed: Additional studies/ records that were reviewed today include: . Review of the above records demonstrates:    ASSESSMENT AND PLAN:  1.  Atrial Fibrillation:  He has chronic atrial fib. He is on ASA 325 mg a day . Discussed NOACs Will discuss next year when he is [age].    Current medicines are reviewed at length with the patient today.  The patient does not have concerns regarding medicines.  The following changes have been made:  no change  Labs/ tests ordered today include:  No orders of the defined types were placed in this encounter.     Disposition:   FU with me in 1 year .     Nahser, Deloris PingPhilip J, MD  03/06/2015 3:35 PM    [facility] [address], [place], [place]  [address] Phone: [phone]([phone]; Fax: [phone]) [phone]   [facility]  [address] [place], [place]  [address] [phone]    Fax [phone]

## 2015-05-06 ENCOUNTER — Telehealth: Payer: Self-pay | Admitting: Cardiovascular Disease

## 2015-05-06 NOTE — Telephone Encounter (Signed)
New Message  Pt calling about metoprolol refill. Saw Dr. Elease HashimotoNahser in May. Pt does not need call back if refill is sent in. Please call back and discuss.

## 2015-05-07 ENCOUNTER — Other Ambulatory Visit: Payer: Self-pay

## 2015-05-07 DIAGNOSIS — I480 Paroxysmal atrial fibrillation: Secondary | ICD-10-CM

## 2015-05-07 MED ORDER — METOPROLOL SUCCINATE ER 25 MG PO TB24: 25.0000 mg | ORAL_TABLET | Freq: Every day | ORAL | Status: DC

## 2016-03-15 ENCOUNTER — Encounter: Payer: Self-pay | Admitting: Cardiovascular Disease

## 2016-03-15 ENCOUNTER — Ambulatory Visit (INDEPENDENT_AMBULATORY_CARE_PROVIDER_SITE_OTHER): Payer: 59 | Admitting: Cardiovascular Disease

## 2016-03-15 VITALS — BP 110/74 | HR 79 | Ht 72.0 in | Wt 225.1 lb

## 2016-03-15 DIAGNOSIS — I10 Essential (primary) hypertension: Secondary | ICD-10-CM

## 2016-03-15 DIAGNOSIS — I482 Chronic atrial fibrillation, unspecified: Secondary | ICD-10-CM

## 2016-03-15 DIAGNOSIS — I480 Paroxysmal atrial fibrillation: Secondary | ICD-10-CM

## 2016-03-15 MED ORDER — METOPROLOL SUCCINATE ER 25 MG PO TB24: 25.0000 mg | ORAL_TABLET | Freq: Every day | ORAL | Status: DC

## 2016-03-15 NOTE — Progress Notes (Signed)
Cardiology Office Note   Date:  03/15/2016   ID:  Moses Odoherty, DOB 1951/07/01, MRN [id number]  PCP:  Mickie Hillier, MD  Cardiologist:   Kristeen Miss, MD   No chief complaint on file.  Damarea is a 65 yo gentleman with a hx of A-fib. He is a previous patient of Dr. Reyes Ivan and then Dr. Deborah Chalk. He walks regularly at work. He has no limitations doing his yardwork. 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 He's not had any episodes of chest pain or shortness breath.  Mar 29, 2013:  Kingdavid is doing well. He is busy taking care of his wife who has cancer. He has not been exercising much because of his busy schedule  April 01, 2014:  Sallie is doing ok.    History of Present Illness: Alto Gandolfo is a 65 y.o. male who presents for further eval of his atrial fib.  He is 65 years old and is on aspirin alone. He's on metoprolol for his rate control. He remains completely asymptomatic. He's able to do all of his normal activities without any significant problems.  May 15 ,2017:  [name] is doing well.   Has well controlled atrial fib  CHADS2VASC is 1 ( age)  No HTN Very active around the house- just retired several weeks ago    Past Medical History  Diagnosis Date  . Atrial fibrillation (HCC) 11/10/2011    Past Surgical History  Procedure Laterality Date  . Tonsillectomy       Current Outpatient Prescriptions  Medication Sig Dispense Refill  . aspirin 325 MG tablet Take 325 mg by mouth daily.    . chlorhexidine (PERIDEX) 0.12 % solution Use as directed 5 mLs in the mouth or throat 2 (two) times daily.     . metoprolol succinate (TOPROL XL) 25 MG 24 hr tablet Take 1 tablet (25 mg total) by mouth daily. 90 tablet 3  . Multiple Vitamins-Minerals (CENTRUM SILVER PO) Take 1 tablet by mouth daily.      No current facility-administered medications for this visit.    Allergies:   Tylenol    Social History:  The patient  reports that he  has been smoking.  He does not have any smokeless tobacco history on file. He reports that he does not drink alcohol.   Family History:  The patient's family history includes Atrial fibrillation in his sister. He was adopted.    ROS:  Please see the history of present illness.    Review of Systems: Constitutional:  denies fever, chills, diaphoresis, appetite change and fatigue.  HEENT: denies photophobia, eye pain, redness, hearing loss, ear pain, congestion, sore throat, rhinorrhea, sneezing, neck pain, neck stiffness and tinnitus.  Respiratory: denies SOB, DOE, cough, chest tightness, and wheezing.  Cardiovascular: denies chest pain, palpitations and leg swelling.  Gastrointestinal: denies nausea, vomiting, abdominal pain, diarrhea, constipation, blood in stool.  Genitourinary: denies dysuria, urgency, frequency, hematuria, flank pain and difficulty urinating.  Musculoskeletal: denies  myalgias, back pain, joint swelling, arthralgias and gait problem.   Skin: denies pallor, rash and wound.  Neurological: denies dizziness, seizures, syncope, weakness, light-headedness, numbness and headaches.   Hematological: denies adenopathy, easy bruising, personal or family bleeding history.  Psychiatric/ Behavioral: denies suicidal ideation, mood changes, confusion, nervousness, sleep disturbance and agitation.      All other systems are reviewed and negative.    PHYSICAL EXAM: VS:  BP 110/74 mmHg  Pulse 79  Ht 6' (1.829 m)  Wt 225 lb 1.9 oz (102.114 kg)  BMI 30.53 kg/m2 , BMI Body mass index is 30.53 kg/(m^2). GEN: Well nourished, well developed, in no acute distress HEENT: normal Neck: no JVD, carotid bruits, or masses Cardiac: Irreg. Irreg. ; no murmurs, rubs, or gallops,no edema  Respiratory:  clear to auscultation bilaterally, normal work of breathing GI: soft, nontender, nondistended, + BS MS: no deformity or atrophy Skin: warm and dry, no rash Neuro:  Strength and sensation are  intact Psych: normal  EKG:  EKG is ordered today. The ekg ordered today demonstrates:  Atrial fib at 79.   Recent Labs: No results found for requested labs within last 365 days.    Lipid Panel    Component Value Date/Time   CHOL 214* 11/05/2011 0856   TRIG 103.0 11/05/2011 0856   HDL 37.90* 11/05/2011 0856   CHOLHDL 6 11/05/2011 0856   VLDL 20.6 11/05/2011 0856   LDLDIRECT 159.9 11/05/2011 0856      Wt Readings from Last 3 Encounters:  03/15/16 225 lb 1.9 oz (102.114 kg)  03/06/15 224 lb 6.4 oz (101.787 kg)  04/01/14 220 lb (99.791 kg)      Other studies Reviewed: Additional studies/ records that were reviewed today include: . Review of the above records demonstrates:    ASSESSMENT AND PLAN:  1.  Atrial Fibrillation:  He has chronic atrial fib. He is on ASA 325 mg a day . Discussed NOACs  This patients CHA2DS2-VASc Score and unadjusted Ischemic Stroke Rate (% per year) is equal to 0.6 % stroke rate/year from a score of 1 Above score calculated as 1 point each if present [CHF, HTN, DM, Vascular=MI/PAD/Aortic Plaque, Age if 65-74, or Male] Above score calculated as 2 points each if present [Age > 75, or Stroke/TIA/TE]  We discussed starting Eliquis or Xarelto.   CHADS2VASC = 1 so he may stay on ASA  He has decided to stay with the ASA and discuss it again next year  Current medicines are reviewed at length with the patient today.  The patient does not have concerns regarding medicines.  The following changes have been made:  no change  Labs/ tests ordered today include:  No orders of the defined types were placed in this encounter.     Disposition:   FU with me in 1 year .     Kristeen MissPhilip Nahser, MD  03/15/2016 4:22 PM    [facility] [address], [place], [place]  [address] Phone: [phone]([phone]; Fax: [phone]   [facility]  [address] [place], [place]  [address] [phone]    Fax [phone]  [phone]

## 2016-03-15 NOTE — Patient Instructions (Signed)
Medication Instructions:  Your physician recommends that you continue on your current medications as directed. Please refer to the Current Medication list given to you today.   Labwork: None Ordered   Testing/Procedures: None Ordered   Follow-Up: Your physician wants you to follow-up in: 1 year with Dr. Nahser.  You will receive a reminder letter in the mail two months in advance. If you don't receive a letter, please call our office to schedule the follow-up appointment.   If you need a refill on your cardiac medications before your next appointment, please call your pharmacy.   Thank you for choosing [facility]! Mikhala Kenan, RN [phone]    

## 2017-03-21 ENCOUNTER — Encounter: Payer: Self-pay | Admitting: Cardiovascular Disease

## 2017-03-21 ENCOUNTER — Ambulatory Visit (INDEPENDENT_AMBULATORY_CARE_PROVIDER_SITE_OTHER): Payer: Medicare Other | Admitting: Cardiovascular Disease

## 2017-03-21 VITALS — BP 130/82 | HR 83 | Ht 72.0 in | Wt 226.8 lb

## 2017-03-21 DIAGNOSIS — I4819 Other persistent atrial fibrillation: Secondary | ICD-10-CM

## 2017-03-21 DIAGNOSIS — I481 Persistent atrial fibrillation: Secondary | ICD-10-CM

## 2017-03-21 MED ORDER — METOPROLOL SUCCINATE ER 25 MG PO TB24: 25.0000 mg | ORAL_TABLET | Freq: Every day | ORAL | 3 refills | Status: DC

## 2017-03-21 NOTE — Progress Notes (Signed)
Cardiology Office Note   Date:  03/21/2017   ID:  Antavion Bartoszek, DOB 1951-05-14, MRN [id number]  PCP:  Catha Gosselin, MD  Cardiologist:   Kristeen Miss, MD   Chief Complaint  Patient presents with  . Atrial Fibrillation   Lenell is a 66 yo gentleman with a hx of A-fib. He is a previous patient of Dr. Reyes Ivan and then Dr. Deborah Chalk. He walks regularly at work. He has no limitations doing his yardwork. 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 He's not had any episodes of chest pain or shortness breath.  Mar 29, 2013:  Jamarri is doing well. He is busy taking care of his wife who has cancer. He has not been exercising much because of his busy schedule  April 01, 2014:  Koehn is doing ok.    History of Present Illness: Que Meneely is a 66 y.o. male who presents for further eval of his atrial fib.  He is 66 years old and is on aspirin alone. He's on metoprolol for his rate control. He remains completely asymptomatic. He's able to do all of his normal activities without any significant problems.  May 15 ,2017:  [name] is doing well.   Has well controlled atrial fib  CHADS2VASC is 1 ( age)  No HTN Very active around the house- just retired several weeks ago   Mar 21, 2017:  Graysen is doing well .  Seen for follow up of his atrial fib  Has joined [employer] .  Has chronic AF  - CHADS2VASC is 1 ( age)    Past Medical History:  Diagnosis Date  . Atrial fibrillation (HCC) 11/10/2011    Past Surgical History:  Procedure Laterality Date  . TONSILLECTOMY       Current Outpatient Prescriptions  Medication Sig Dispense Refill  . aspirin 325 MG tablet Take 325 mg by mouth daily.    . chlorhexidine (PERIDEX) 0.12 % solution Use as directed 5 mLs in the mouth or throat 2 (two) times daily.     . metoprolol succinate (TOPROL XL) 25 MG 24 hr tablet Take 1 tablet (25 mg total) by mouth daily. 90 tablet 3  . Multiple Vitamins-Minerals (CENTRUM  SILVER PO) Take 1 tablet by mouth daily.      No current facility-administered medications for this visit.     Allergies:   Tylenol [acetaminophen]    Social History:  The patient  reports that he has been smoking.  He has never used smokeless tobacco. He reports that he does not drink alcohol.   Family History:  The patient's family history includes Atrial fibrillation in his sister. He was adopted.    ROS:  Please see the history of present illness.    Review of Systems: Constitutional:  denies fever, chills, diaphoresis, appetite change and fatigue.  HEENT: denies photophobia, eye pain, redness, hearing loss, ear pain, congestion, sore throat, rhinorrhea, sneezing, neck pain, neck stiffness and tinnitus.  Respiratory: denies SOB, DOE, cough, chest tightness, and wheezing.  Cardiovascular: denies chest pain, palpitations and leg swelling.  Gastrointestinal: denies nausea, vomiting, abdominal pain, diarrhea, constipation, blood in stool.  Genitourinary: denies dysuria, urgency, frequency, hematuria, flank pain and difficulty urinating.  Musculoskeletal: denies  myalgias, back pain, joint swelling, arthralgias and gait problem.   Skin: denies pallor, rash and wound.  Neurological: denies dizziness, seizures, syncope, weakness, light-headedness, numbness and headaches.   Hematological: denies adenopathy, easy bruising, personal or family bleeding history.  Psychiatric/ Behavioral:  denies suicidal ideation, mood changes, confusion, nervousness, sleep disturbance and agitation.      All other systems are reviewed and negative.    PHYSICAL EXAM: VS:  BP 130/82 (BP Location: Left Arm, Patient Position: Sitting, Cuff Size: Normal)   Pulse 83   Ht 6' (1.829 m)   Wt 226 lb 12.8 oz (102.9 kg)   SpO2 96%   BMI 30.76 kg/m  , BMI Body mass index is 30.76 kg/m. GEN: Well nourished, well developed, in no acute distress  HEENT: normal  Neck: no JVD, carotid bruits, or masses Cardiac:  Irreg. Irreg. ; no murmurs, rubs, or gallops,no edema  Respiratory:  clear to auscultation bilaterally, normal work of breathing GI: soft, nontender, nondistended, + BS MS: no deformity or atrophy  Skin: warm and dry, no rash Neuro:  Strength and sensation are intact Psych: normal  EKG:  EKG is ordered today. The ekg ordered today demonstrates:  Atrial fib at 83   Recent Labs: No results found for requested labs within last 8760 hours.    Lipid Panel    Component Value Date/Time   CHOL 214 (H) 11/05/2011 0856   TRIG 103.0 11/05/2011 0856   HDL 37.90 (L) 11/05/2011 0856   CHOLHDL 6 11/05/2011 0856   VLDL 20.6 11/05/2011 0856   LDLDIRECT 159.9 11/05/2011 0856      Wt Readings from Last 3 Encounters:  03/21/17 226 lb 12.8 oz (102.9 kg)  03/15/16 225 lb 1.9 oz (102.1 kg)  03/06/15 224 lb 6.4 oz (101.8 kg)      Other studies Reviewed: Additional studies/ records that were reviewed today include: . Review of the above records demonstrates:    ASSESSMENT AND PLAN:  1.  Atrial Fibrillation:  He has chronic atrial fib.will decrease ASA to 81 .Marland Kitchen. Discussed NOACs  This patients CHA2DS2-VASc Score and unadjusted Ischemic Stroke Rate (% per year) is equal to 0.6 % stroke rate/year from a score of 1 Above score calculated as 1 point each if present [CHF, HTN, DM, Vascular=MI/PAD/Aortic Plaque, Age if 65-74, or Male] Above score calculated as 2 points each if present [Age > 75, or Stroke/TIA/TE]  We discussed starting Eliquis or Xarelto.   CHADS2VASC = 1 so he may stay on ASA    We discussed Various options for his atrial fib.   He does not want to take anticoagulation.  We discussed the fact that he would absolutely need to take anticoagulation if we decided to do a cardioversion or consider ablation. Since he is asymptomatic, I would not recommend Afib ablation .   Current medicines are reviewed at length with the patient today.  The patient does not have concerns regarding  medicines.  The following changes have been made:  no change  Labs/ tests ordered today include:  No orders of the defined types were placed in this encounter.    Disposition:   FU with me in 1 year .     Kristeen MissPhilip Anayiah Howden, MD  03/21/2017 11:32 AM    [facility] [address], [place], [place]  [address] Phone: [phone](336) [phone]; Fax: [phone]

## 2017-03-21 NOTE — Patient Instructions (Signed)
Medication Instructions:  Stop Aspirin 325mg . Start taking Aspirin 81mg . Your physician recommends that you continue on your current medications as directed. Please refer to the Current Medication list given to you today.   Labwork: None ordered   Follow-Up: Your physician recommends that you schedule a follow-up appointment in: 1 year with Dr. Elease HashimotoNahser.

## 2017-06-14 ENCOUNTER — Emergency Department (HOSPITAL_COMMUNITY): Payer: Medicare Other

## 2017-06-14 ENCOUNTER — Encounter (HOSPITAL_COMMUNITY): Payer: Self-pay

## 2017-06-14 ENCOUNTER — Emergency Department (HOSPITAL_COMMUNITY)
Admission: EM | Admit: 2017-06-14 | Discharge: 2017-06-14 | Disposition: A | Discharge status: Home / Self Care | Payer: Medicare Other | Attending: Emergency Medicine | Admitting: Emergency Medicine

## 2017-06-14 DIAGNOSIS — R519 Headache, unspecified: Secondary | ICD-10-CM

## 2017-06-14 DIAGNOSIS — I10 Essential (primary) hypertension: Secondary | ICD-10-CM | POA: Diagnosis not present

## 2017-06-14 DIAGNOSIS — F17210 Nicotine dependence, cigarettes, uncomplicated: Secondary | ICD-10-CM | POA: Insufficient documentation

## 2017-06-14 DIAGNOSIS — R51 Headache: Secondary | ICD-10-CM | POA: Diagnosis not present

## 2017-06-14 DIAGNOSIS — Z7982 Long term (current) use of aspirin: Secondary | ICD-10-CM | POA: Insufficient documentation

## 2017-06-14 DIAGNOSIS — Z79899 Other long term (current) drug therapy: Secondary | ICD-10-CM | POA: Diagnosis not present

## 2017-06-14 LAB — CBC WITH DIFFERENTIAL/PLATELET
BASOS ABS: 0 10*3/uL (ref 0.0–0.1)
Basophils Relative: 0 %
Eosinophils Absolute: 0.5 10*3/uL (ref 0.0–0.7)
Eosinophils Relative: 6 %
HCT: 42.6 % (ref 39.0–52.0)
HEMOGLOBIN: 14.2 g/dL (ref 13.0–17.0)
LYMPHS ABS: 2.6 10*3/uL (ref 0.7–4.0)
LYMPHS PCT: 30 %
MCH: 31.4 pg (ref 26.0–34.0)
MCHC: 33.3 g/dL (ref 30.0–36.0)
MCV: 94.2 fL (ref 78.0–100.0)
Monocytes Absolute: 0.9 10*3/uL (ref 0.1–1.0)
Monocytes Relative: 10 %
NEUTROS ABS: 4.9 10*3/uL (ref 1.7–7.7)
NEUTROS PCT: 54 %
PLATELETS: 268 10*3/uL (ref 150–400)
RBC: 4.52 MIL/uL (ref 4.22–5.81)
RDW: 13.5 % (ref 11.5–15.5)
WBC: 8.9 10*3/uL (ref 4.0–10.5)

## 2017-06-14 LAB — BASIC METABOLIC PANEL
Anion gap: 6 (ref 5–15)
BUN: 10 mg/dL (ref 6–20)
CHLORIDE: 101 mmol/L (ref 101–111)
CO2: 27 mmol/L (ref 22–32)
CREATININE: 1.14 mg/dL (ref 0.61–1.24)
Calcium: 8.7 mg/dL — ABNORMAL LOW (ref 8.9–10.3)
GFR calc Af Amer: >60 mL/min (ref >60)
GLUCOSE: 115 mg/dL — AB (ref 65–99)
Potassium: 4.5 mmol/L (ref 3.5–5.1)
SODIUM: 134 mmol/L — AB (ref 135–145)

## 2017-06-14 LAB — SEDIMENTATION RATE: Sed Rate: 10 mm/hr (ref 0–16)

## 2017-06-14 IMAGING — CT CT HEAD W/O CM
4 series · 16 of 47 positions shown, 18 images · non-contrast
Comparison: [DATE]

CLINICAL DATA: Headaches

EXAM:
CT HEAD WITHOUT CONTRAST
TECHNIQUE: Contiguous axial images were obtained from the base of the skull
through the vertex without intravenous contrast.

[Series 3: head without · axial · non-contrast · 0.46mm/px · z∈[-200,-75]mm · 7 of 35 slices shown, 9 images]
[im 5/35  brain]
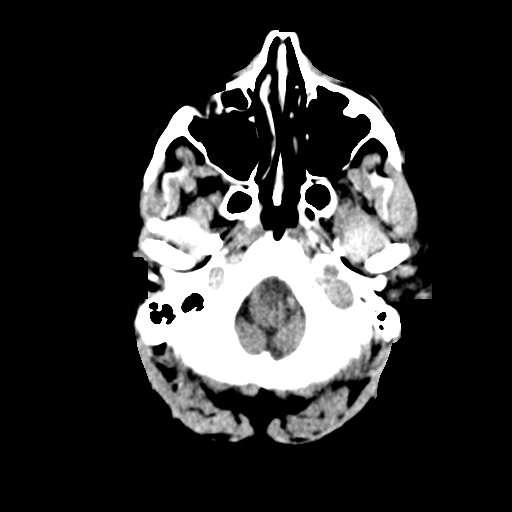
[im 5/35  bone]
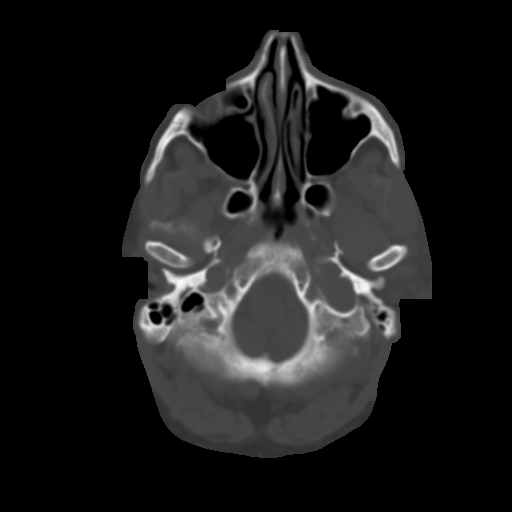
[im 9/35  brain]
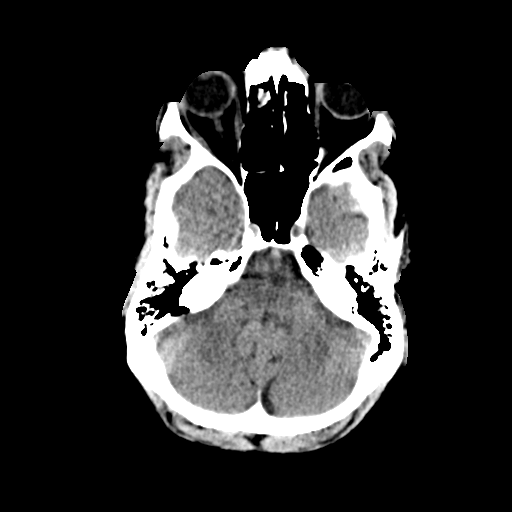
[im 13/35  brain]
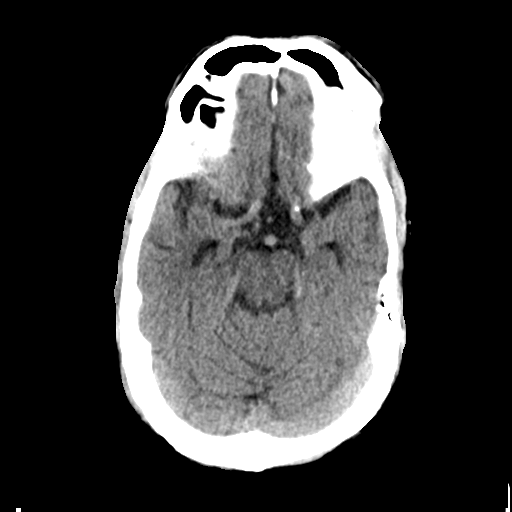
[im 18/35  brain]
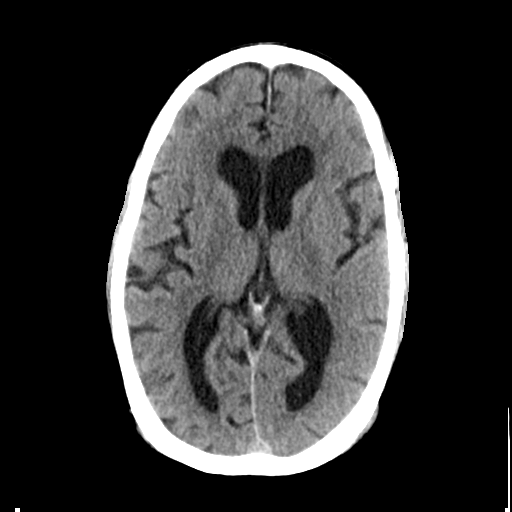
[im 22/35  brain]
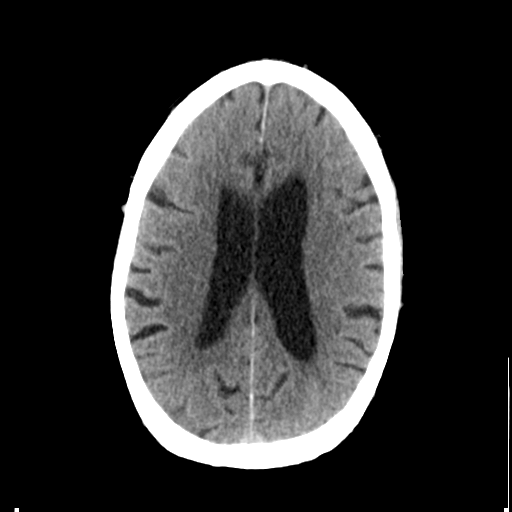
[im 22/35  bone]
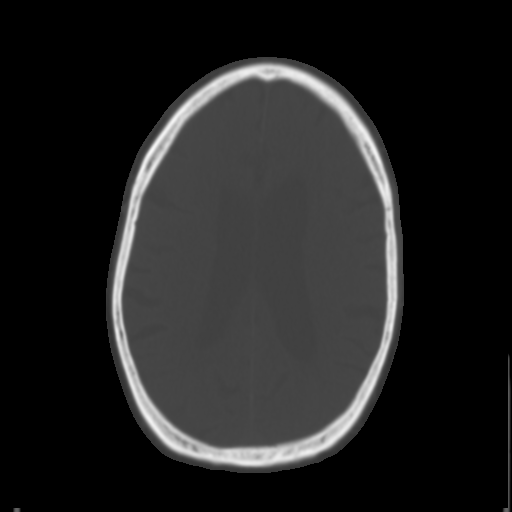
[im 26/35  brain]
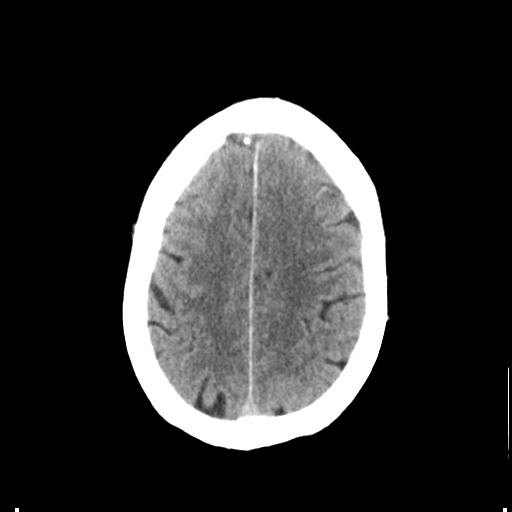
[im 30/35  brain]
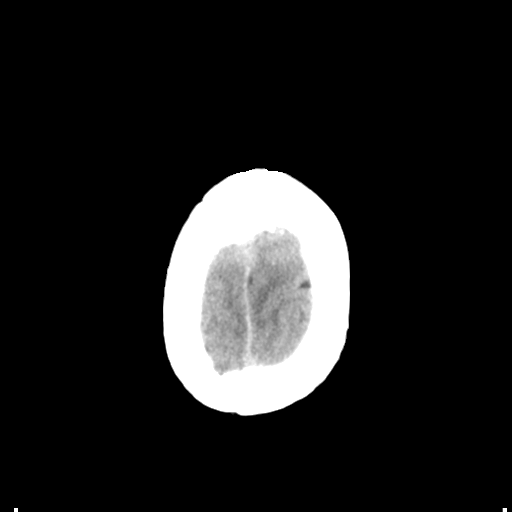

[Series 4: head bone · axial · 0.46mm/px · z∈[-204,-170]mm · 3 of 87 slices shown]
[im 9/87  bone]
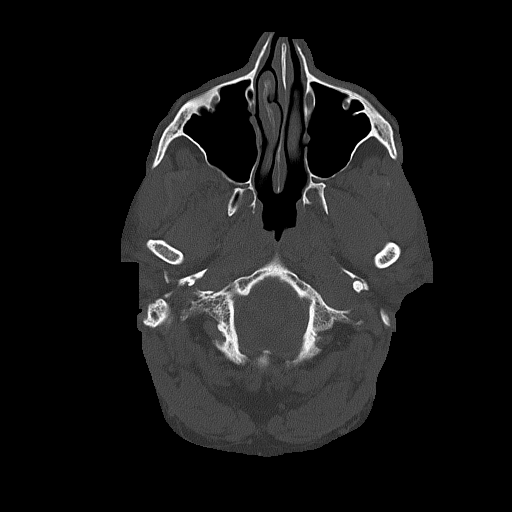
[im 18/87  bone]
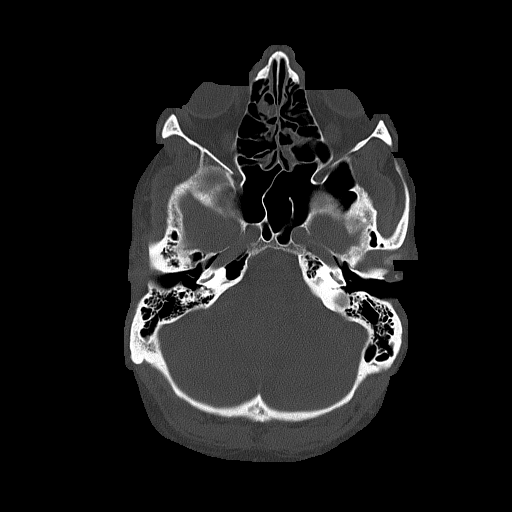
[im 26/87  bone]
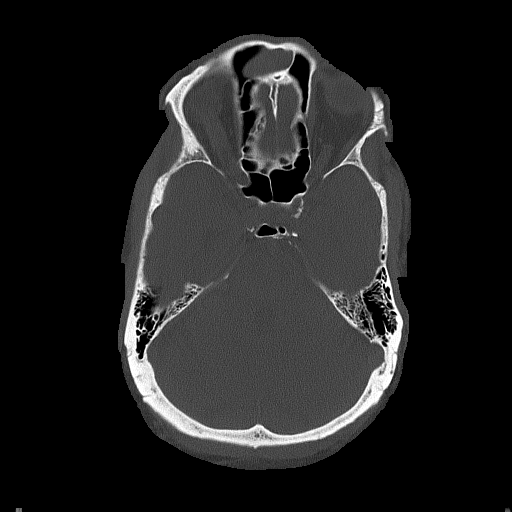

[Series 5: head without cor · coronal · non-contrast · 0.34mm/px · 3 of 76 slices shown]
[im 26/76  brain]
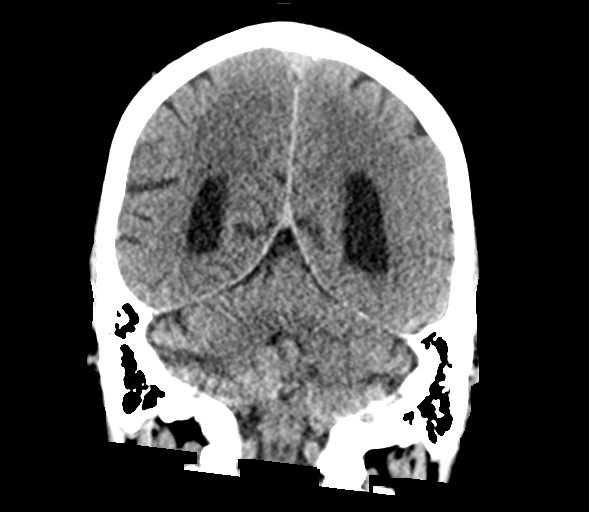
[im 34/76  brain]
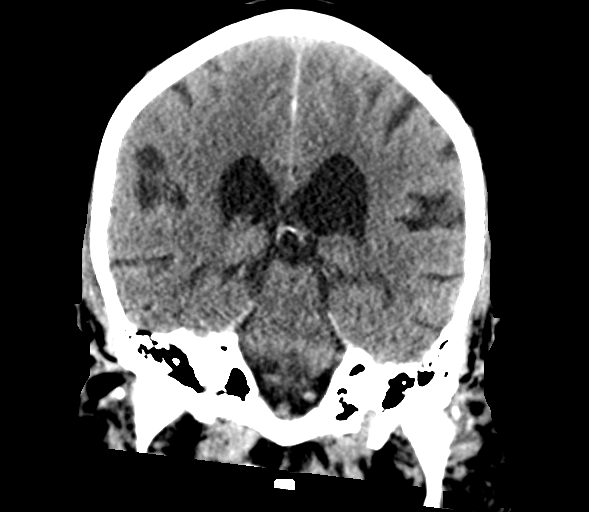
[im 42/76  brain]
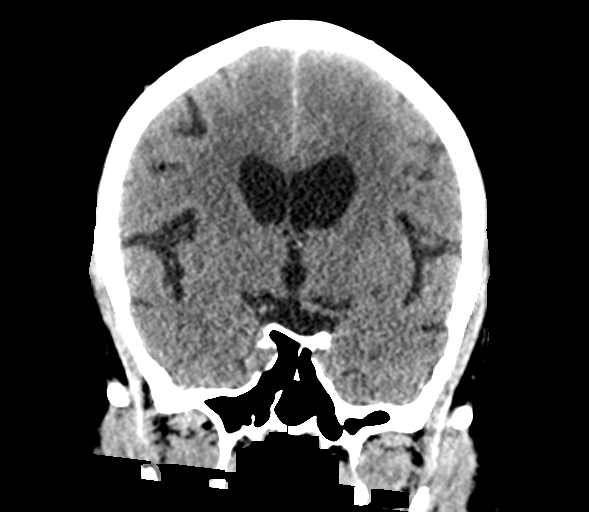

[Series 6: head without sag · sagittal · non-contrast · 0.34mm/px · 3 of 61 slices shown]
[im 21/61  brain]
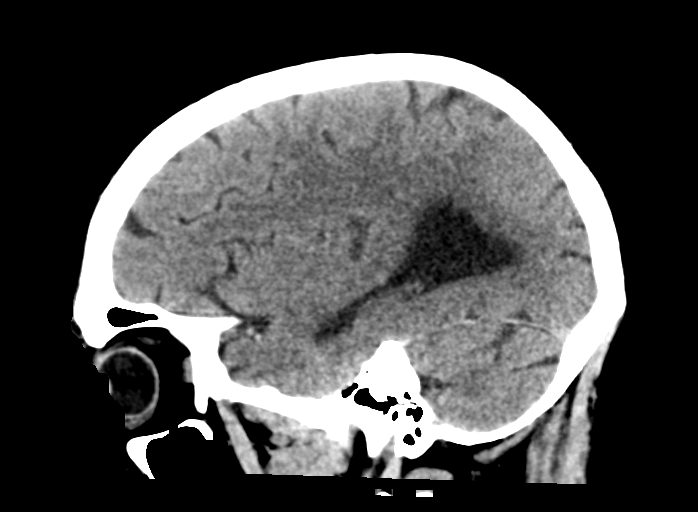
[im 31/61  brain]
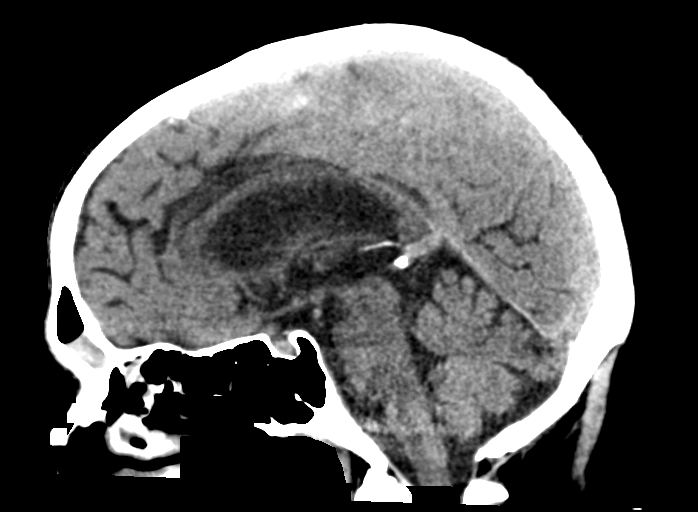
[im 41/61  brain]
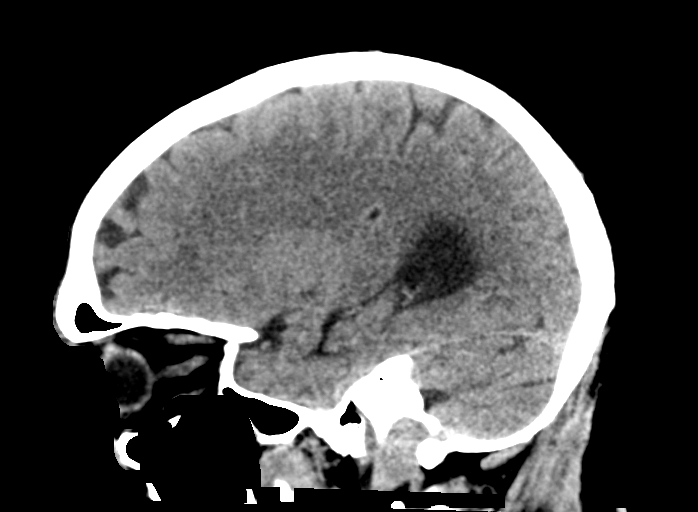

[16 of 47 positions shown; findings below may reference images not displayed]

FINDINGS: Brain: No acute territorial infarction, hemorrhage or intracranial
mass is seen. There is slight cortical atrophy. The ventricles are
somewhat prominent in size. These are slightly increased compared to
the prior CT from [FK].

Vascular: No hyperdense vessels. Scattered calcifications at the
carotid siphons.

Skull: No fracture or suspicious bone lesion

Sinuses/Orbits: Mild mucosal thickening in the ethmoid and right
frontal sinus. No acute orbital abnormality.

Other: None
IMPRESSION: 1. Negative for hemorrhage or intracranial mass
2. Prominent ventricle size, slight increased since prior study from
[FK]. Although some atrophy is present and could account for the
slightly enlarged ventricles, could also consider normal pressure
hydrocephalus.

## 2017-06-14 IMAGING — CT CT ANGIO NECK
1 of 8 series · 6 of 33 positions shown · IV contrast (OMNI 350)
Comparison: Head CT [DATE]

CLINICAL DATA: Headache with unequal pupils

EXAM:
CT ANGIOGRAPHY HEAD AND NECK
TECHNIQUE: Multidetector CT imaging of the head and neck was performed using
the standard protocol during bolus administration of intravenous
contrast. Multiplanar CT image reconstructions and MIPs were
obtained to evaluate the vascular anatomy. Carotid stenosis
measurements (when applicable) are obtained utilizing NASCET
criteria, using the distal internal carotid diameter as the
denominator.
CONTRAST:  50 mL Isovue 370

[Series 8: cta neck axial · axial · 0.39mm/px · z∈[-398,-136]mm · 6 of 369 slices shown]
[im 53/369  soft-tissue]
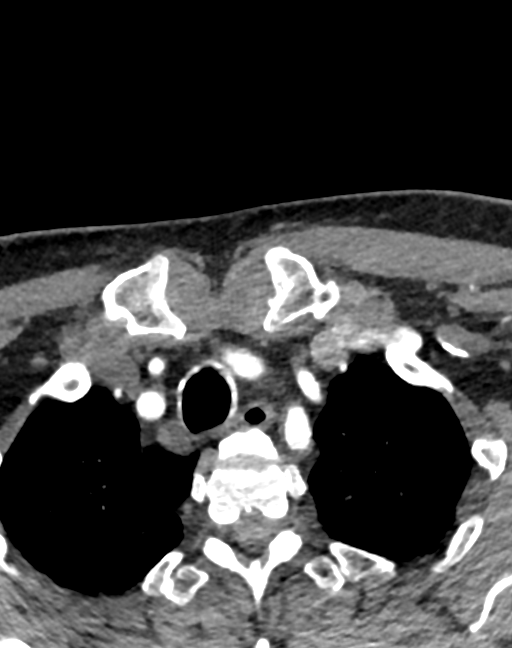
[im 106/369  bone]
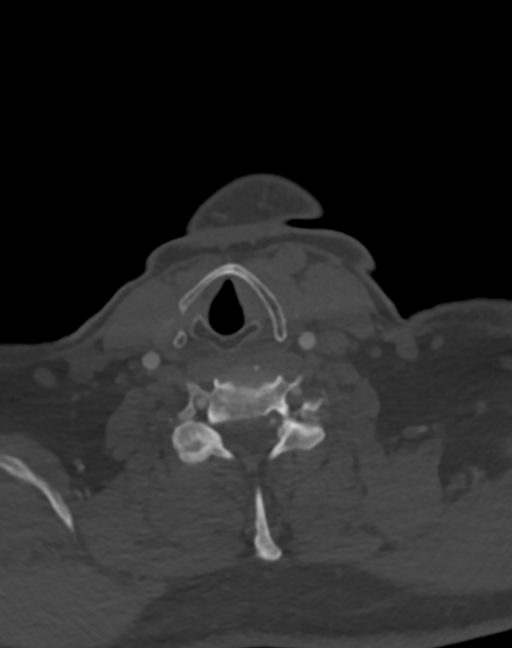
[im 158/369  soft-tissue]
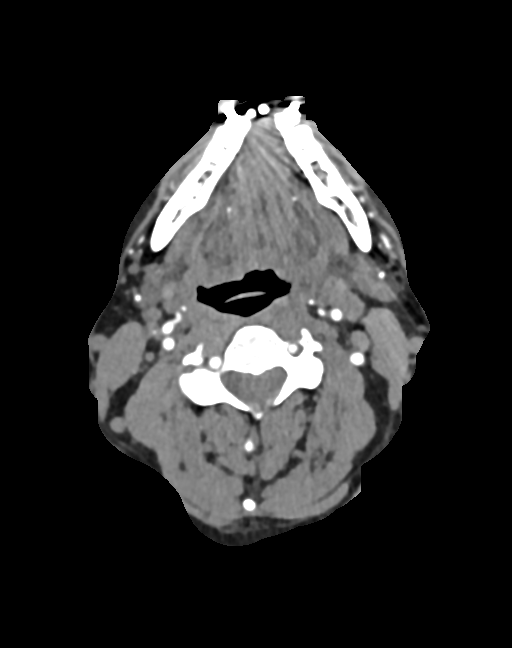
[im 211/369  bone]
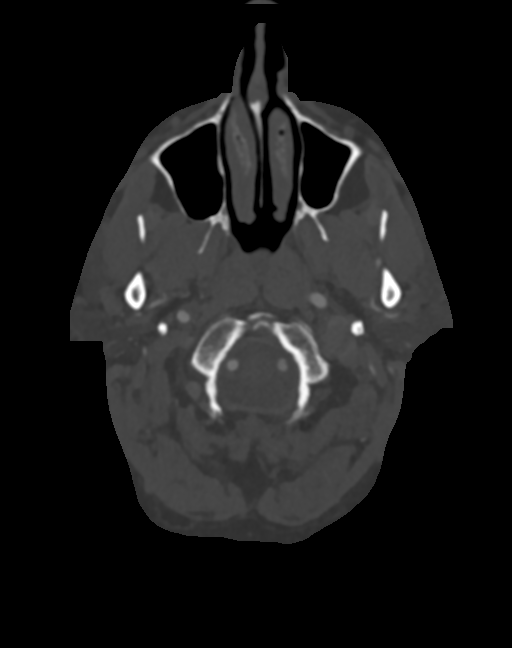
[im 263/369  soft-tissue]
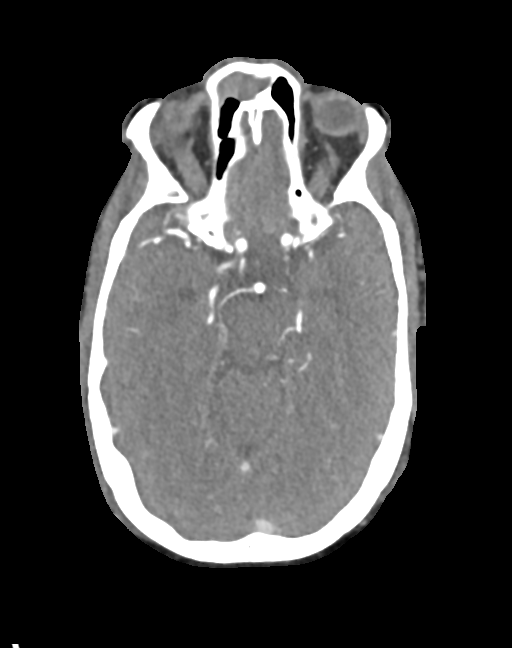
[im 316/369  bone]
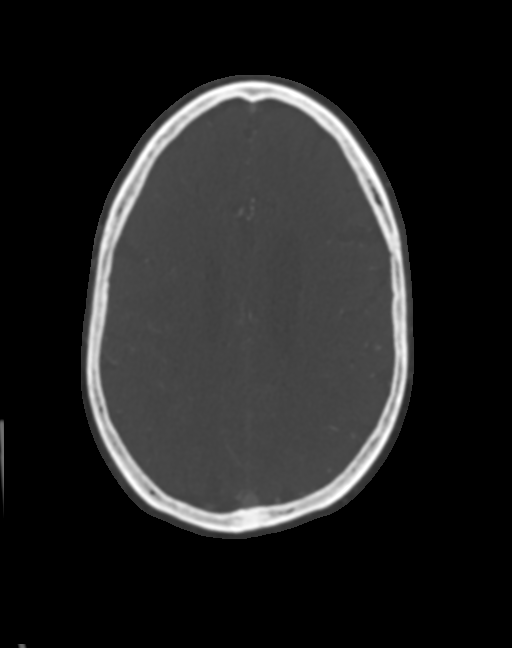

[6 of 33 positions shown; findings below may reference images not displayed]

FINDINGS: CTA NECK FINDINGS

Aortic arch: There is atherosclerotic calcification in the aortic
arch. No arch dissection. There is a normal variant aortic arch
branching pattern with the brachiocephalic and left common carotid
arteries sharing a common origin. The visualized proximal subclavian
arteries are normal.

Right carotid system: The right common carotid origin is widely
patent. There is no common carotid or internal carotid artery
dissection or aneurysm. No hemodynamically significant stenosis.

Left carotid system: The left common carotid origin is widely
patent. There is no common carotid or internal carotid artery
dissection or aneurysm. No hemodynamically significant stenosis.

Vertebral arteries: The vertebral system is right dominant. Both
vertebral artery origins are normal. Both vertebral arteries are
normal to their confluence with the basilar artery.

Skeleton: There is no bony spinal canal stenosis. No lytic or
blastic lesions.

Other neck: The nasopharynx is clear. The oropharynx and hypopharynx
are normal. The epiglottis is normal. The supraglottic larynx,
glottis and subglottic larynx are normal. No retropharyngeal
collection. The parapharyngeal spaces are preserved. The parotid and
submandibular glands are normal. No sialolithiasis or salivary
ductal dilatation. The thyroid gland is normal. There is no cervical
lymphadenopathy.

Upper chest: No pneumothorax or pleural effusion. No nodules or
masses.

Review of the MIP images confirms the above findings

CTA HEAD FINDINGS

Anterior circulation:

--Intracranial internal carotid arteries: Normal.

--Anterior cerebral arteries: Normal.

--Middle cerebral arteries: Normal.

--Posterior communicating arteries: Absent bilaterally.

Posterior circulation:

--Posterior cerebral arteries: Normal.

--Superior cerebellar arteries: Normal.

--Basilar artery: Normal.

--Anterior inferior cerebellar arteries: Normal.

--Posterior inferior cerebellar arteries: Normal.

Venous sinuses: As permitted by contrast timing, patent.

Anatomic variants: None

Delayed phase: No parenchymal contrast enhancement.

Review of the MIP images confirms the above findings
IMPRESSION: 1. No aneurysm.
2. No emergent large vessel occlusion or high-grade stenosis.
3. No carotid or vertebral dissection or hemodynamically significant
stenosis.
4.  Aortic Atherosclerosis ([ZB]-[ZB]).

## 2017-06-14 MED ORDER — TETRACAINE HCL 0.5 % OP SOLN
2.0000 [drp] | Freq: Once | OPHTHALMIC | Status: AC
  Administered 2017-06-14: 2 [drp] via OPHTHALMIC
  Filled 2017-06-14: qty 4

## 2017-06-14 MED ORDER — IOPAMIDOL (ISOVUE-370) INJECTION 76%
INTRAVENOUS | Status: AC
  Administered 2017-06-14: 50 mL
  Filled 2017-06-14: qty 50

## 2017-06-14 NOTE — ED Notes (Signed)
Visual acuity test with corrective lenses  Bilateral: 10/10 R eye: 10/10 L eye: 10/10

## 2017-06-14 NOTE — ED Triage Notes (Signed)
Per Pt, Pt is coming from [facility] with noted right sided intermittent headache x 2 weeks. Pt denies any slurred speech, unilateral weakness, or chest pain. Pt has no neuro deficit noted except unequal pupils.

## 2017-06-14 NOTE — ED Provider Notes (Signed)
[facility]EMERGENCY DEPT Provider Note   CSN: [id number] Arrival date & time: 06/14/17  1551     History   Chief Complaint Chief Complaint  Patient presents with  . Headache    HPI Cody Whitney is a 66 y.o. male.  HPI  66 year old male with a history of atrial fibrillation currently on full dose aspirin presents with intermittent right-sided headache for 2 weeks. Seen by his PCP today and sent in for evaluation because it was noticed that his right pupil was smaller than his left. Patient states that he has not noticed this before his PCP pointed out but now that he looks at his eyes through the mirror he notices a difference. The headache never fully goes away and is typically about a 1 or 2 out of 10. When it comes it is an 8 or 9 out of 10. The pain does not radiate but encompasses his right lateral neck, right temple and parietal scalp, and right maxillary cheek. There is pain around his eye but his eye does not hurt. There is no blurry vision or photophobia. He does not have any weakness or numbness in his extremities. He has occasionally gotten nauseated but no vomiting. Putting a warm Howell and taking Aleve will help relieve the pain temporarily. No fevers or neck stiffness.  Past Medical History:  Diagnosis Date  . Atrial fibrillation (HCC) 11/10/2011    Patient Active Problem List   Diagnosis Date Noted  . HTN (hypertension) 04/01/2014  . Atrial fibrillation (HCC) 11/10/2011    Past Surgical History:  Procedure Laterality Date  . FACIAL FRACTURE SURGERY    . TONSILLECTOMY         Home Medications    Prior to Admission medications   Medication Sig Start Date End Date Taking? Authorizing Provider  aspirin 325 MG tablet Take 325 mg by mouth daily.   Yes [provider]  metoprolol succinate (TOPROL XL) 25 MG 24 hr tablet Take 1 tablet (25 mg total) by mouth daily. 03/21/17 03/27/18 Yes Nahser, Deloris Ping, MD  Multiple Vitamins-Minerals (CENTRUM SILVER PO) Take  1 tablet by mouth daily.    Yes [provider]    Family History Family History  Problem Relation Age of Onset  . Adopted: Yes  . Atrial fibrillation Sister     Social History Social History  Substance Use Topics  . Smoking status: Current Every Day Smoker    Packs/day: 0.50    Types: Cigarettes  . Smokeless tobacco: Never Used  . Alcohol use No     Allergies   Oxycontin [oxycodone] and Tylenol [acetaminophen]   Review of Systems Review of Systems  Constitutional: Negative for fever.  Eyes: Negative for photophobia, pain and visual disturbance.  Respiratory: Negative for shortness of breath.   Cardiovascular: Negative for chest pain.  Gastrointestinal: Positive for nausea. Negative for vomiting.  Musculoskeletal: Positive for neck pain. Negative for neck stiffness.  Neurological: Positive for headaches. Negative for weakness and numbness.  All other systems reviewed and are negative.    Physical Exam Updated Vital Signs BP (!) 150/95   Pulse 80   Temp 98.3 F (36.8 C) (Oral)   Resp 18   Ht 6' (1.829 m)   Wt 104.3 kg (230 lb)   SpO2 99%   BMI 31.19 kg/m   Physical Exam  Constitutional: He is oriented to person, place, and time. He appears well-developed and well-nourished. No distress.  HENT:  Head: Normocephalic and atraumatic.  Right Ear: External  ear normal.  Left Ear: External ear normal.  Nose: Nose normal.  No tenderness over the temple or maxillary cheek. No swelling  Eyes: EOM are normal. Right eye exhibits no discharge. Left eye exhibits no discharge. Right pupil is reactive. Left pupil is reactive.  Right pupil is a couple mm smaller than right, both are equally reactive to light and accomodation  Neck: Normal range of motion. Neck supple. Muscular tenderness present. No spinous process tenderness present.    Cardiovascular: Normal rate, regular rhythm and normal heart sounds.   Pulmonary/Chest: Effort normal and breath sounds  normal.  Abdominal: Soft. There is no tenderness.  Musculoskeletal: He exhibits no edema.  Neurological: He is alert and oriented to person, place, and time.  CN 3-12 grossly intact. 5/5 strength in all 4 extremities. Grossly normal sensation. Normal finger to nose.   Skin: Skin is warm and dry. He is not diaphoretic.  Nursing note and vitals reviewed.    ED Treatments / Results  Labs (all labs ordered are listed, but only abnormal results are displayed) Labs Reviewed  BASIC METABOLIC PANEL - Abnormal; Notable for the following:       Result Value   Sodium 134 (*)    Glucose, Bld 115 (*)    Calcium 8.7 (*)    All other components within normal limits  CBC WITH DIFFERENTIAL/PLATELET  SEDIMENTATION RATE    EKG  EKG Interpretation None       Radiology Ct Angio Head W Or Wo Contrast  Result Date: 06/14/2017 CLINICAL DATA:  Headache with unequal pupils EXAM: CT ANGIOGRAPHY HEAD AND NECK TECHNIQUE: Multidetector CT imaging of the head and neck was performed using the standard protocol during bolus administration of intravenous contrast. Multiplanar CT image reconstructions and MIPs were obtained to evaluate the vascular anatomy. Carotid stenosis measurements (when applicable) are obtained utilizing NASCET criteria, using the distal internal carotid diameter as the denominator. CONTRAST:  50 mL Isovue 370 COMPARISON:  Head CT 06/14/2017 FINDINGS: CTA NECK FINDINGS Aortic arch: There is atherosclerotic calcification in the aortic arch. No arch dissection. There is a normal variant aortic arch branching pattern with the brachiocephalic and left common carotid arteries sharing a common origin. The visualized proximal subclavian arteries are normal. Right carotid system: The right common carotid origin is widely patent. There is no common carotid or internal carotid artery dissection or aneurysm. No hemodynamically significant stenosis. Left carotid system: The left common carotid origin is  widely patent. There is no common carotid or internal carotid artery dissection or aneurysm. No hemodynamically significant stenosis. Vertebral arteries: The vertebral system is right dominant. Both vertebral artery origins are normal. Both vertebral arteries are normal to their confluence with the basilar artery. Skeleton: There is no bony spinal canal stenosis. No lytic or blastic lesions. Other neck: The nasopharynx is clear. The oropharynx and hypopharynx are normal. The epiglottis is normal. The supraglottic larynx, glottis and subglottic larynx are normal. No retropharyngeal collection. The parapharyngeal spaces are preserved. The parotid and submandibular glands are normal. No sialolithiasis or salivary ductal dilatation. The thyroid gland is normal. There is no cervical lymphadenopathy. Upper chest: No pneumothorax or pleural effusion. No nodules or masses. Review of the MIP images confirms the above findings CTA HEAD FINDINGS Anterior circulation: --Intracranial internal carotid arteries: Normal. --Anterior cerebral arteries: Normal. --Middle cerebral arteries: Normal. --Posterior communicating arteries: Absent bilaterally. Posterior circulation: --Posterior cerebral arteries: Normal. --Superior cerebellar arteries: Normal. --Basilar artery: Normal. --Anterior inferior cerebellar arteries: Normal. --Posterior inferior cerebellar arteries:  Normal. Venous sinuses: As permitted by contrast timing, patent. Anatomic variants: None Delayed phase: No parenchymal contrast enhancement. Review of the MIP images confirms the above findings IMPRESSION: 1. No aneurysm. 2. No emergent large vessel occlusion or high-grade stenosis. 3. No carotid or vertebral dissection or hemodynamically significant stenosis. 4.  Aortic Atherosclerosis (ICD10-I70.0). Electronically Signed   By: Deatra Robinson M.D.   On: 06/14/2017 21:05   Ct Head Wo Contrast  Result Date: 06/14/2017 CLINICAL DATA:  Headaches EXAM: CT HEAD WITHOUT  CONTRAST TECHNIQUE: Contiguous axial images were obtained from the base of the skull through the vertex without intravenous contrast. COMPARISON:  03/30/2007 FINDINGS: Brain: No acute territorial infarction, hemorrhage or intracranial mass is seen. There is slight cortical atrophy. The ventricles are somewhat prominent in size. These are slightly increased compared to the prior CT from 2008. Vascular: No hyperdense vessels. Scattered calcifications at the carotid siphons. Skull: No fracture or suspicious bone lesion Sinuses/Orbits: Mild mucosal thickening in the ethmoid and right frontal sinus. No acute orbital abnormality. Other: None IMPRESSION: 1. Negative for hemorrhage or intracranial mass 2. Prominent ventricle size, slight increased since prior study from 2008. Although some atrophy is present and could account for the slightly enlarged ventricles, could also consider normal pressure hydrocephalus. Electronically Signed   By: Jasmine Pang M.D.   On: 06/14/2017 19:43   Ct Angio Neck W And/or Wo Contrast  Result Date: 06/14/2017 CLINICAL DATA:  Headache with unequal pupils EXAM: CT ANGIOGRAPHY HEAD AND NECK TECHNIQUE: Multidetector CT imaging of the head and neck was performed using the standard protocol during bolus administration of intravenous contrast. Multiplanar CT image reconstructions and MIPs were obtained to evaluate the vascular anatomy. Carotid stenosis measurements (when applicable) are obtained utilizing NASCET criteria, using the distal internal carotid diameter as the denominator. CONTRAST:  50 mL Isovue 370 COMPARISON:  Head CT 06/14/2017 FINDINGS: CTA NECK FINDINGS Aortic arch: There is atherosclerotic calcification in the aortic arch. No arch dissection. There is a normal variant aortic arch branching pattern with the brachiocephalic and left common carotid arteries sharing a common origin. The visualized proximal subclavian arteries are normal. Right carotid system: The right common  carotid origin is widely patent. There is no common carotid or internal carotid artery dissection or aneurysm. No hemodynamically significant stenosis. Left carotid system: The left common carotid origin is widely patent. There is no common carotid or internal carotid artery dissection or aneurysm. No hemodynamically significant stenosis. Vertebral arteries: The vertebral system is right dominant. Both vertebral artery origins are normal. Both vertebral arteries are normal to their confluence with the basilar artery. Skeleton: There is no bony spinal canal stenosis. No lytic or blastic lesions. Other neck: The nasopharynx is clear. The oropharynx and hypopharynx are normal. The epiglottis is normal. The supraglottic larynx, glottis and subglottic larynx are normal. No retropharyngeal collection. The parapharyngeal spaces are preserved. The parotid and submandibular glands are normal. No sialolithiasis or salivary ductal dilatation. The thyroid gland is normal. There is no cervical lymphadenopathy. Upper chest: No pneumothorax or pleural effusion. No nodules or masses. Review of the MIP images confirms the above findings CTA HEAD FINDINGS Anterior circulation: --Intracranial internal carotid arteries: Normal. --Anterior cerebral arteries: Normal. --Middle cerebral arteries: Normal. --Posterior communicating arteries: Absent bilaterally. Posterior circulation: --Posterior cerebral arteries: Normal. --Superior cerebellar arteries: Normal. --Basilar artery: Normal. --Anterior inferior cerebellar arteries: Normal. --Posterior inferior cerebellar arteries: Normal. Venous sinuses: As permitted by contrast timing, patent. Anatomic variants: None Delayed phase: No parenchymal contrast enhancement. Review  of the MIP images confirms the above findings IMPRESSION: 1. No aneurysm. 2. No emergent large vessel occlusion or high-grade stenosis. 3. No carotid or vertebral dissection or hemodynamically significant stenosis. 4.   Aortic Atherosclerosis (ICD10-I70.0). Electronically Signed   By: Deatra RobinsonKevin  Herman M.D.   On: 06/14/2017 21:05    Procedures Procedures (including critical care time)  Medications Ordered in ED Medications  tetracaine (PONTOCAINE) 0.5 % ophthalmic solution 2 drop (2 drops Right Eye Given by Other 06/14/17 1855)  iopamidol (ISOVUE-370) 76 % injection (50 mLs  Contrast Given 06/14/17 2021)     Initial Impression / Assessment and Plan / ED Course  I have reviewed the triage vital signs and the nursing notes.  Pertinent labs & imaging results that were available during my care of the patient were reviewed by me and considered in my medical decision making (see chart for details).     No clear source of patient's headache. Given pain near eye and age, evaluated his IOP, which is 14. No significant headache at this time. Doubt temporal arteritis. D/w Dr Otelia LimesLindzen of neuro, no further recs. His symptoms are not c/w NPH, thus no further workup now, likely the mild dilation is age related. Recommends outpatient neuro f/u. Unclear why he has mild pupil differences, but at this time appears stable for outpatient workup. Neuro exam benign.   Final Clinical Impressions(s) / ED Diagnoses   Final diagnoses:  Right-sided headache    New Prescriptions Discharge Medication List as of 06/14/2017 10:00 PM       Pricilla LovelessGoldston, Doshia Dalia, MD 06/15/17 [id number]

## 2017-06-14 NOTE — ED Notes (Signed)
Spoke with triage nurse about pt.

## 2017-07-28 ENCOUNTER — Ambulatory Visit (INDEPENDENT_AMBULATORY_CARE_PROVIDER_SITE_OTHER): Payer: Medicare Other | Admitting: Neurology

## 2017-07-28 ENCOUNTER — Encounter: Payer: Self-pay | Admitting: Neurology

## 2017-07-28 DIAGNOSIS — G43709 Chronic migraine without aura, not intractable, without status migrainosus: Secondary | ICD-10-CM | POA: Diagnosis not present

## 2017-07-28 DIAGNOSIS — IMO0002 Reserved for concepts with insufficient information to code with codable children: Secondary | ICD-10-CM

## 2017-07-28 MED ORDER — SUMATRIPTAN SUCCINATE 50 MG PO TABS: 50.0000 mg | ORAL_TABLET | ORAL | 6 refills | Status: DC | PRN

## 2017-07-28 NOTE — Progress Notes (Signed)
PATIENT: Cody Whitney DOB: 03-16-1951  Chief Complaint  Patient presents with  . Right-sided headache    Reports right-sided headache since mid-August.  States he went to his PCP and was noted to have asymmetrical pupils.  He was sent to the ED for further evaluation.  Since this visit, he still has intermittent sharp pains and constant soreness.  He uses warm compresses , OTC NSAIDS and very sparingly Fioricet for pain.  Marland Kitchen PCP    Little, Lennette Bihari, MD - referred from hospital     HISTORICAL  Cody Whitney is a 66 year old male, seen in refer by  his primary care Dr. Hulan Fess for evaluation of right-sided headaches, initial evaluation was on July 28 2017  He had a history of chronic atrial fibrillation, only taking aspirin 325 mg daily, reported a history of migraine headache as a child, usually is triggered by chocolate, and tomato.  He rarely has headache as about, had a bout of headache lasting for couple weeks in 2010, in between, he rarely get headaches, in early August 2018, without triggers, he began to have frequent headaches, starting at right occipital area, radiating to right cheek, settled at right frontal constant pressure, sometimes become pounding headache, no light noise sensitivity, no nausea, he performed lie down in dark quiet room.  He has tried over-the-counter large dose of aspirin 500 mg 2 tablets up to 4 times a day to control his headaches, also tried Fioricet with some help,  He presented to the emergency room June 14 2017 after found he had asymmetric pupil size, right pupil was smaller, I personally reviewed CT head without contrast showed mild generalized atrophy, no acute abnormality. CT angiogram of head and neck were normal.  Laboratory evaluation showed BNP mild elevated glucose, normal CBC, ESR.   REVIEW OF SYSTEMS: Full 14 system review of systems performed and notable only for Headaches  ALLERGIES: Allergies  Allergen Reactions  .  Hydrocodone Nausea And Vomiting  . Oxycontin [Oxycodone] Nausea And Vomiting  . Tylenol [Acetaminophen] Nausea And Vomiting    WITH CODEINE     HOME MEDICATIONS: Current Outpatient Prescriptions  Medication Sig Dispense Refill  . aspirin 325 MG tablet Take 325 mg by mouth daily.    . butalbital-acetaminophen-caffeine (FIORICET, ESGIC) 50-325-40 MG tablet Take by mouth as needed for headache.    . metoprolol succinate (TOPROL XL) 25 MG 24 hr tablet Take 1 tablet (25 mg total) by mouth daily. 90 tablet 3  . Multiple Vitamins-Minerals (CENTRUM SILVER PO) Take 1 tablet by mouth daily.      No current facility-administered medications for this visit.     PAST MEDICAL HISTORY: Past Medical History:  Diagnosis Date  . Atrial fibrillation ([place]) 11/10/2011  . Headache     PAST SURGICAL HISTORY: Past Surgical History:  Procedure Laterality Date  . FACIAL FRACTURE SURGERY    . TONSILLECTOMY      FAMILY HISTORY: Family History  Problem Relation Age of Onset  . Adopted: Yes  . Atrial fibrillation Sister   . Migraines Sister     SOCIAL HISTORY:  Social History   Social History  . Marital status: Married    Spouse name: N/A  . Number of children: 1  . Years of education: Masters   Occupational History  . Retired    Social History Main Topics  . Smoking status: Current Every Day Smoker    Packs/day: 0.50    Types: Cigarettes  . Smokeless tobacco: Never  Used  . Alcohol use No     Comment: Last used 1980  . Drug use: No  . Sexual activity: Not on file   Other Topics Concern  . Not on file   Social History Narrative   Lives at home alone.   Left-handed.   2-3 cups caffeine daily.     PHYSICAL EXAM   Vitals:   07/28/17 0850  BP: (!) 151/93  Pulse: 74  Weight: 228 lb 4 oz (103.5 kg)  Height: 6' (1.829 m)    Not recorded      Body mass index is 30.96 kg/m.  PHYSICAL EXAMNIATION:  Gen: NAD, conversant, well nourised, obese, well groomed                      Cardiovascular: Regular rate rhythm, no peripheral edema, warm, nontender. Eyes: Conjunctivae clear without exudates or hemorrhage Neck: Supple, no carotid bruits. Pulmonary: Clear to auscultation bilaterally   NEUROLOGICAL EXAM:  MENTAL STATUS: Speech:    Speech is normal; fluent and spontaneous with normal comprehension.  Cognition:     Orientation to time, place and person     Normal recent and remote memory     Normal Attention span and concentration     Normal Language, naming, repeating,spontaneous speech     Fund of knowledge   CRANIAL NERVES: CN II: Visual fields are full to confrontation. Fundoscopic exam is normal with sharp discs and no vascular changes. Pupils are round and briskly reactive to light. Right pupil was 1 mm smaller than left pupil  CN III, IV, VI: extraocular movement are normal. No ptosis. CN V: Facial sensation is intact to pinprick in all 3 divisions bilaterally. Corneal responses are intact.  CN VII: Face is symmetric with normal eye closure and smile. CN VIII: Hearing is normal to rubbing fingers CN IX, X: Palate elevates symmetrically. Phonation is normal. CN XI: Head turning and shoulder shrug are intact CN XII: Tongue is midline with normal movements and no atrophy.  MOTOR: There is no pronator drift of out-stretched arms. Muscle bulk and tone are normal. Muscle strength is normal.  REFLEXES: Reflexes are 2+ and symmetric at the biceps, triceps, knees, and ankles. Plantar responses are flexor.  SENSORY: Intact to light touch, pinprick, positional sensation and vibratory sensation are intact in fingers and toes.  COORDINATION: Rapid alternating movements and fine finger movements are intact. There is no dysmetria on finger-to-nose and heel-knee-shin.    GAIT/STANCE: Posture is normal. Gait is steady with normal steps, base, arm swing, and turning. Heel and toe walking are normal. Tandem gait is normal.  Romberg is absent.   DIAGNOSTIC  DATA (LABS, IMAGING, TESTING) - I reviewed patient records, labs, notes, testing and imaging myself where available.   ASSESSMENT AND PLAN  Rakwon Letourneau is a 66 y.o. male   Chronic migraine  Imitrex as needed  Normal CT head, CT angiogram of head and neck  ESR and C-reactive protein, to rule out temporal arteritis  Physiological anisocoria  Right pupil is 1 mm smaller than left pupil, both briskly reactive to light    Marcial Pacas, M.D. Ph.D.  [facility] [address] [place], [place] [address] Ph: [phone] Fax: [phone]  CC: Hulan Fess, MD

## 2017-07-29 LAB — SEDIMENTATION RATE: Sed Rate: 13 mm/hr (ref 0–30)

## 2017-07-29 LAB — C-REACTIVE PROTEIN: CRP: 4.5 mg/L (ref 0.0–4.9)

## 2017-07-29 LAB — TSH: TSH: 1.97 u[IU]/mL (ref 0.450–4.500)

## 2018-03-22 ENCOUNTER — Ambulatory Visit: Payer: Medicare Other | Admitting: Cardiovascular Disease

## 2018-03-22 ENCOUNTER — Encounter: Payer: Self-pay | Admitting: Cardiovascular Disease

## 2018-03-22 VITALS — BP 136/72 | HR 79 | Ht 72.0 in | Wt 236.0 lb

## 2018-03-22 DIAGNOSIS — I482 Chronic atrial fibrillation, unspecified: Secondary | ICD-10-CM

## 2018-03-22 MED ORDER — ASPIRIN 81 MG PO TBEC: 81.0000 mg | DELAYED_RELEASE_TABLET | Freq: Every day | ORAL | Status: DC

## 2018-03-22 NOTE — Patient Instructions (Addendum)
.  Medication Instructions:  Your physician has recommended you make the following change in your medication:  Decrease Aspirin to  once daily.   Labwork: None Ordered    Testing/Procedures: None Ordered    Follow-Up: Your physician wants you to follow-up in: 1 year with Dr. Elease Hashimoto. You will receive a reminder letter in the mail two months in advance. If you don't receive a letter, please call our office to schedule the follow-up appointment.     Any Other Special Instructions Will Be Listed Below (If Applicable).      If you need a refill on your cardiac medications before your next appointment, please call your pharmacy.

## 2018-03-22 NOTE — Progress Notes (Signed)
Cardiology Office Note   Date:  03/22/2018   ID:  Cody Whitney, DOB 01-30-1951, MRN [id number]  PCP:  Catha Gosselin, MD  Cardiologist:   Kristeen Miss, MD   Chief Complaint  Patient presents with  . Atrial Fibrillation   Cody Whitney is a 67 yo gentleman with a hx of A-fib. He is a previous patient of Dr. Reyes Whitney and then Dr. Deborah Whitney. He walks regularly at work. He has no limitations doing his yardwork. 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 He's not had any episodes of chest pain or shortness breath.  Mar 29, 2013:  Cody Whitney is doing well. He is busy taking care of his wife who has cancer. He has not been exercising much because of his busy schedule  April 01, 2014:  Cody Whitney is doing ok.      Cody Whitney is a 67 y.o. male who presents for further eval of his atrial fib.  He is 67 years old and is on aspirin alone. He's on metoprolol for his rate control. He remains completely asymptomatic. He's able to do all of his normal activities without any significant problems.  May 15 ,2017:  Cody Whitney is doing well.   Has well controlled atrial fib  CHADS2VASC is 1 ( age)  No HTN Very active around the house- just retired several weeks ago   Mar 21, 2017:  Cody Whitney is doing well .  Seen for follow up of his atrial fib  Has joined [employer] .  Has chronic AF  - CHADS2VASC is 1 ( age)   Mar 22, 2018  Doing well. CHADs2VASC is 1 age - no hx of HTN  Works out some . limite by migraine headaches , is on Imitrex which helps    Past Medical History:  Diagnosis Date  . Atrial fibrillation (HCC) 11/10/2011  . Headache     Past Surgical History:  Procedure Laterality Date  . FACIAL FRACTURE SURGERY    . TONSILLECTOMY       Current Outpatient Medications  Medication Sig Dispense Refill  . aspirin 325 MG tablet Take 325 mg by mouth daily.    . butalbital-acetaminophen-caffeine (FIORICET, ESGIC) 50-325-40 MG tablet Take by mouth as needed for  headache.    . metoprolol succinate (TOPROL XL) 25 MG 24 hr tablet Take 1 tablet (25 mg total) by mouth daily. 90 tablet 3  . Multiple Vitamins-Minerals (CENTRUM SILVER PO) Take 1 tablet by mouth daily.     . SUMAtriptan (IMITREX) 50 MG tablet Take 1 tablet (50 mg total) by mouth every 2 (two) hours as needed for migraine. May repeat in 2 hours if headache persists or recurs. 12 tablet 6   No current facility-administered medications for this visit.     Allergies:   Hydrocodone; Oxycontin [oxycodone]; and Tylenol [acetaminophen]    Social History:  The patient  reports that he has been smoking cigarettes.  He has been smoking about 0.50 packs per day. He has never used smokeless tobacco. He reports that he does not drink alcohol or use drugs.   Family History:  The patient's family history includes Atrial fibrillation in his sister; Migraines in his sister. He was adopted.    ROS:  Please see the history of present illness.      Physical Exam: Blood pressure 136/72, pulse 79, height 6' (1.829 m), weight 236 lb (107 kg), SpO2 97 %.  GEN:  Well nourished, well developed in no acute distress HEENT: Normal  NECK: No JVD; No carotid bruits LYMPHATICS: No lymphadenopathy CARDIAC: Irregularly irregular  RESPIRATORY:  Clear to auscultation without rales, wheezing or rhonchi  ABDOMEN: Soft, non-tender, non-distended MUSCULOSKELETAL:  No edema; No deformity  SKIN: Warm and dry NEUROLOGIC:  Alert and oriented x 3  EKG: Mar 22, 2018: Atrial fibrillation with a heart rate of 79.  No ST or T wave changes. Recent Labs: 06/14/2017: BUN 10; Creatinine, Ser 1.14; Hemoglobin 14.2; Platelets 268; Potassium 4.5; Sodium 134 07/28/2017: TSH 1.970    Lipid Panel    Component Value Date/Time   CHOL 214 (H) 11/05/2011 0856   TRIG 103.0 11/05/2011 0856   HDL 37.90 (L) 11/05/2011 0856   CHOLHDL 6 11/05/2011 0856   VLDL 20.6 11/05/2011 0856   LDLDIRECT 159.9 11/05/2011 0856      Wt Readings from  Last 3 Encounters:  03/22/18 236 lb (107 kg)  07/28/17 228 lb 4 oz (103.5 kg)  06/14/17 230 lb (104.3 kg)      Other studies Reviewed: Additional studies/ records that were reviewed today include: . Review of the above records demonstrates:    ASSESSMENT AND PLAN:  1.  Atrial Fibrillation:    This patients CHA2DS2-VASc Score and unadjusted Ischemic Stroke Rate (% per year) is equal to 0.6 % stroke rate/year from a score of 1 Above score calculated as 1 point each if present [CHF, HTN, DM, Vascular=MI/PAD/Aortic Plaque, Age if 65-74, or Male] Above score calculated as 2 points each if present [Age > 75, or Stroke/TIA/TE]  Still wants to stick with ASA   .   Current medicines are reviewed at length with the patient today.  The patient does not have concerns regarding medicines.  The following changes have been made:  no change  Labs/ tests ordered today include:  No orders of the defined types were placed in this encounter.    Disposition:   FU with me in 1 year .     Kristeen Miss, MD  03/22/2018 3:43 PM    [facility] [address] [place], [place], [place]  [address] Phone: [phone]; Fax: [phone]

## 2018-05-02 ENCOUNTER — Other Ambulatory Visit: Payer: Self-pay | Admitting: Cardiovascular Disease

## 2018-05-02 DIAGNOSIS — I4819 Other persistent atrial fibrillation: Secondary | ICD-10-CM

## 2018-05-02 MED ORDER — METOPROLOL SUCCINATE ER 25 MG PO TB24: 25.0000 mg | ORAL_TABLET | Freq: Every day | ORAL | 3 refills | Status: DC

## 2018-07-01 ENCOUNTER — Other Ambulatory Visit: Payer: Self-pay | Admitting: Family Medicine

## 2018-07-01 DIAGNOSIS — Z136 Encounter for screening for cardiovascular disorders: Secondary | ICD-10-CM

## 2018-07-01 DIAGNOSIS — F17200 Nicotine dependence, unspecified, uncomplicated: Secondary | ICD-10-CM

## 2018-07-12 ENCOUNTER — Ambulatory Visit
Admission: RE | Admit: 2018-07-12 | Discharge: 2018-07-12 | Disposition: A | Discharge status: Home / Self Care | Payer: Medicare Other | Source: Ambulatory Visit | Attending: Family Medicine | Admitting: Family Medicine

## 2018-07-12 DIAGNOSIS — F17200 Nicotine dependence, unspecified, uncomplicated: Secondary | ICD-10-CM

## 2018-07-12 DIAGNOSIS — Z136 Encounter for screening for cardiovascular disorders: Secondary | ICD-10-CM

## 2018-07-12 IMAGING — US US ABDOMINAL AORTA SCREENING AAA
1 series · 8 of 8 positions shown · non-contrast
Comparison: None.

CLINICAL DATA: Screening for abdominal aortic aneurysm.

EXAM:
ULTRASOUND OF ABDOMINAL AORTA
TECHNIQUE: Ultrasound examination of the abdominal aorta was performed to
evaluate for abdominal aortic aneurysm.

[Series 1: us abdominal aorta screening aaa · 0.34mm/px · 8 of 8 slices shown]
[im 1/8]
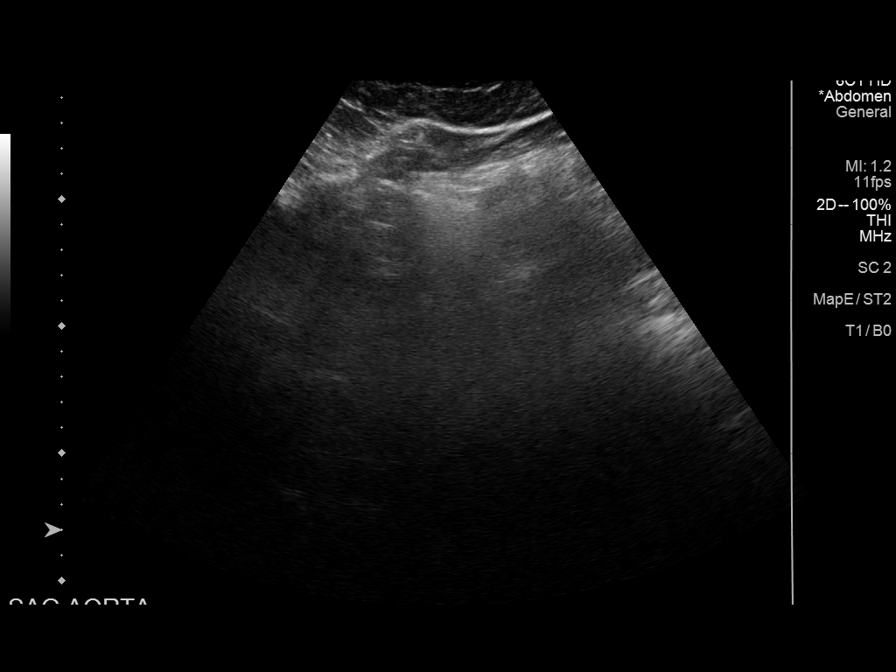
[im 2/8]
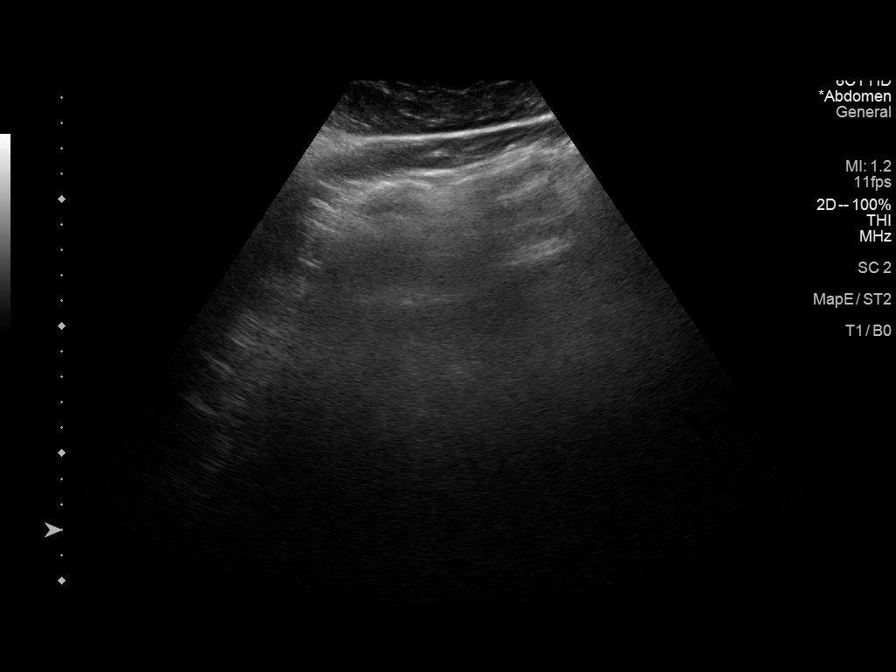
[im 3/8]
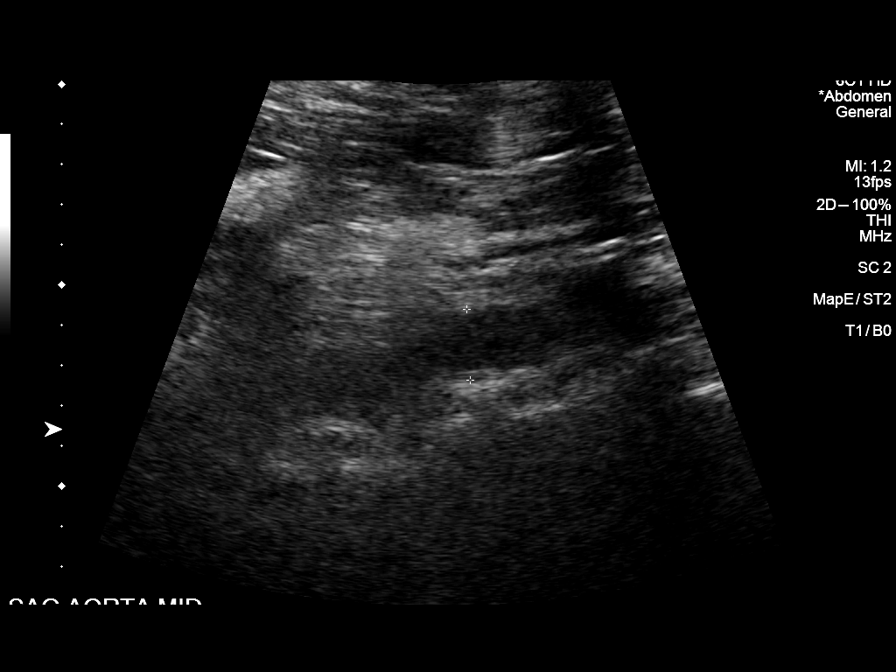
[im 4/8]
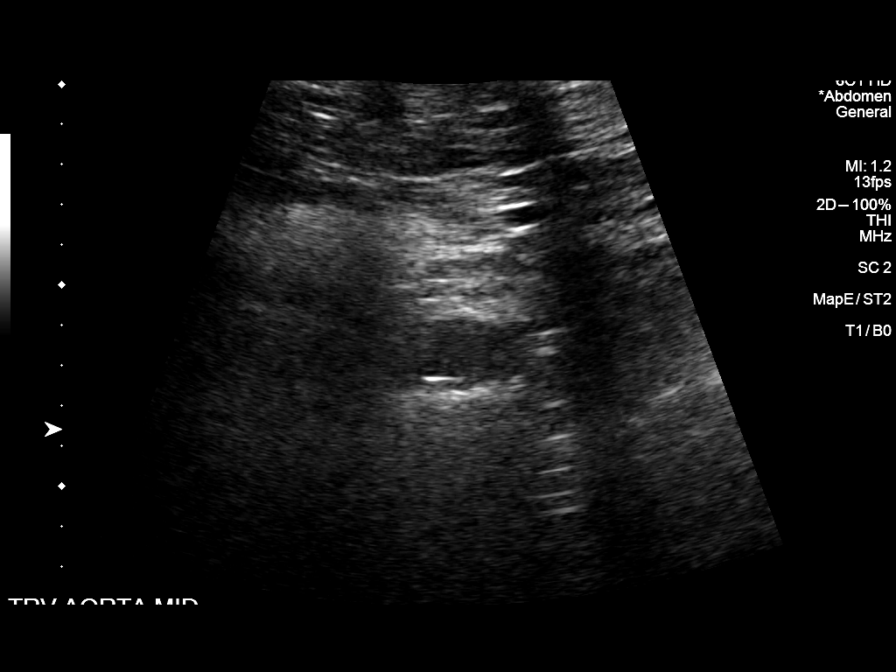
[im 5/8]
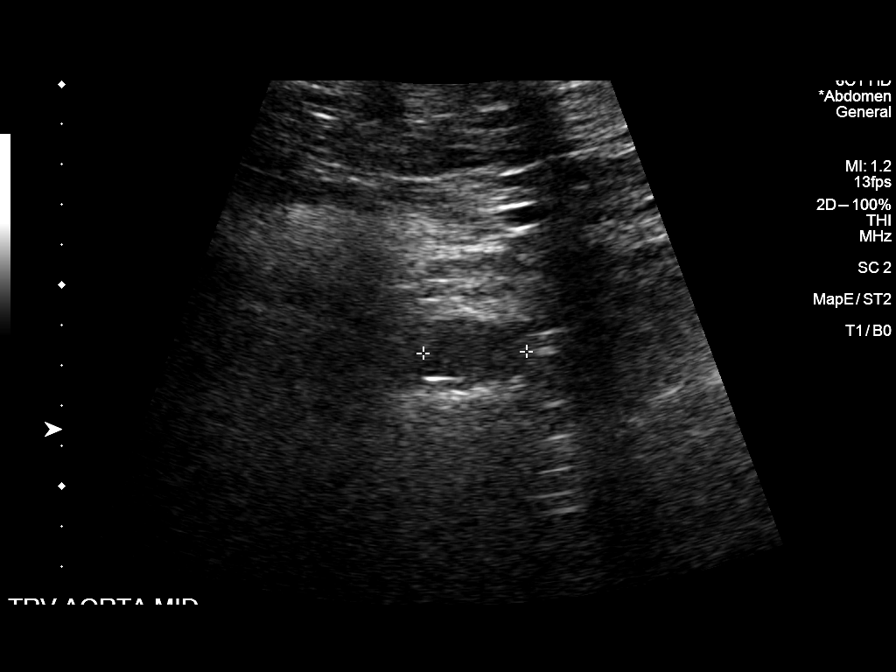
[im 6/8]
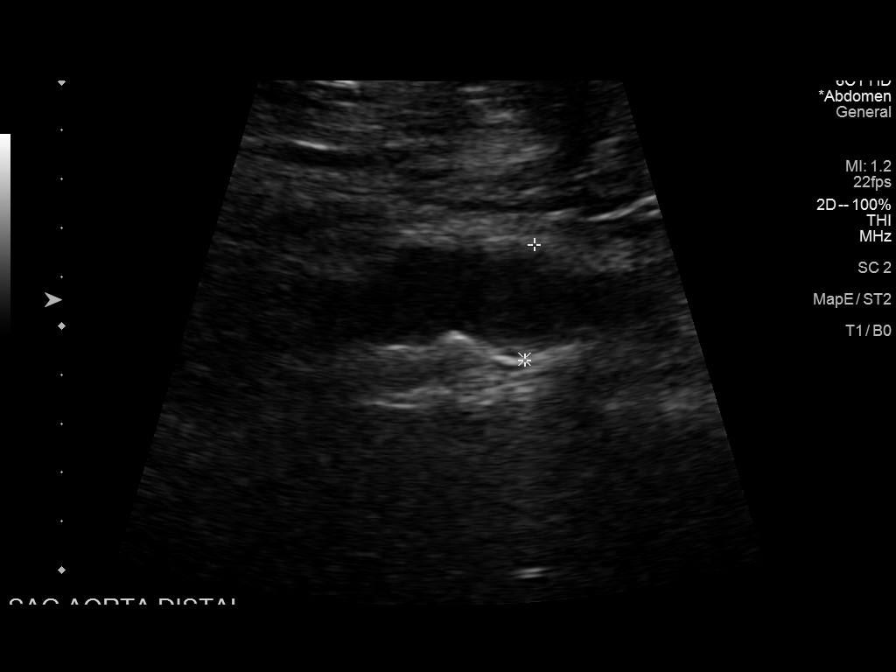
[im 7/8]
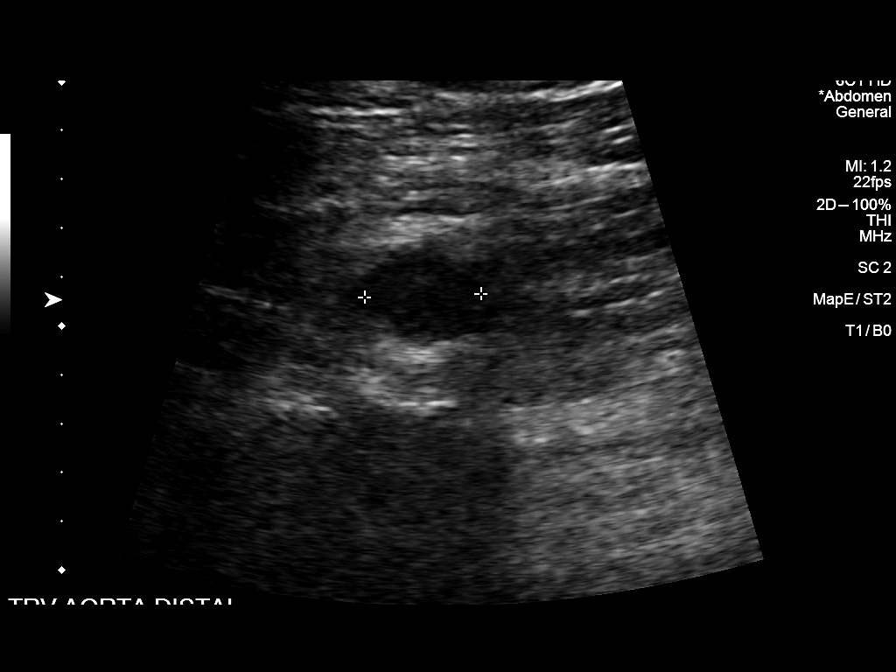
[im 8/8]
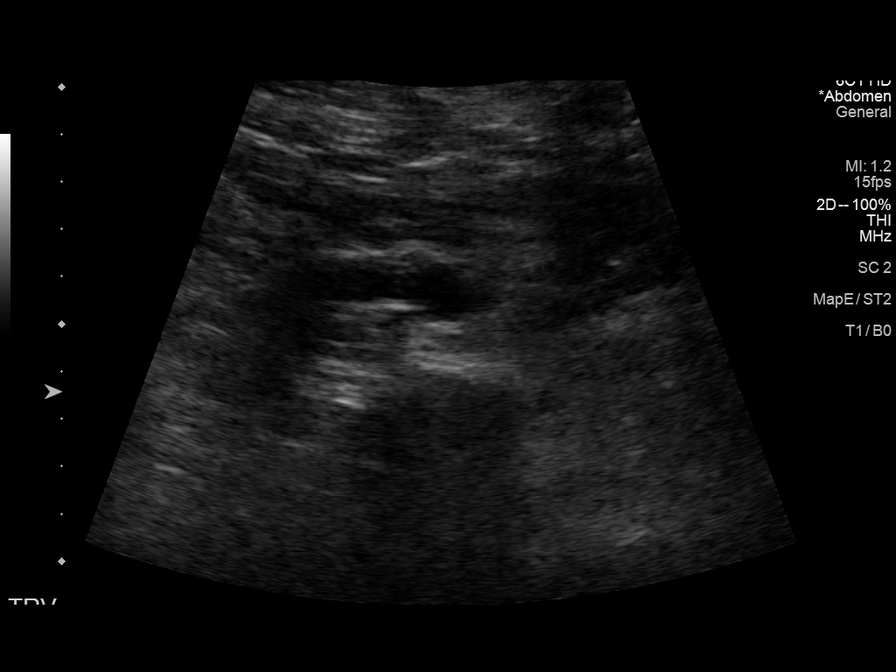

[8 of 8 positions shown; findings below may reference images not displayed]

FINDINGS: Abdominal aortic measurements as follows:

Proximal:  Obscured

Mid:  1.8 x 2.6 cm

Distal:  2.4 x 2.4 cm
IMPRESSION: Normal appearing abdominal aorta. No evidence of aneurysmal
dilatation.

## 2019-03-13 ENCOUNTER — Telehealth: Payer: Self-pay | Admitting: Nurse Practitioner

## 2019-03-13 ENCOUNTER — Telehealth: Payer: Self-pay | Admitting: Cardiovascular Disease

## 2019-03-13 NOTE — Telephone Encounter (Signed)
Patient is returning a call from [place]

## 2019-03-13 NOTE — Telephone Encounter (Signed)
Scheduled patient for virtual visit with Dr. Elease Hashimoto on 5/21. He has MyChart and consent has been sent to that account. I advised patient to call back with questions or concerns prior to that call. He thanked me for the call.

## 2019-03-13 NOTE — Telephone Encounter (Signed)
Left message for patient to call back regarding appointment with Dr. Nahser on 5/21 

## 2019-03-13 NOTE — Telephone Encounter (Signed)
See additional telephone encounter dated 5/12

## 2019-03-21 ENCOUNTER — Telehealth: Payer: Self-pay | Admitting: Cardiovascular Disease

## 2019-03-21 NOTE — Telephone Encounter (Signed)
Went over phone visit with process for 5/21/20appt with the pt and he verbalized understanding.. will have weight, BP, HR ready for his call.. pt has read and consented to info sent to him on 03/13/19 by Eligha Bridegroom RN. Will call back if he has any other questions.

## 2019-03-21 NOTE — Telephone Encounter (Signed)
New message  Patient states that he would like a phone call to assist in setting up his virtual visit for tomorrow. Please call.

## 2019-03-21 NOTE — Progress Notes (Signed)
Virtual Visit via Video Note   This visit type was conducted due to national recommendations for restrictions regarding the COVID-19 Pandemic (e.g. social distancing) in an effort to limit this patient's exposure and mitigate transmission in our community.  Due to his co-morbid illnesses, this patient is at least at moderate risk for complications without adequate follow up.  This format is felt to be most appropriate for this patient at this time.  All issues noted in this document were discussed and addressed.  A limited physical exam was performed with this format.  Please refer to the patient's chart for his consent to telehealth for [facility].   Date:  03/22/2019   ID:  Cody Whitney, DOB 11-May-1951, MRN [id number]  Patient Location: Home Provider Location: Office  PCP:  Catha Gosselin, MD  Cardiologist:   Janequa Kipnis  Electrophysiologist:  None   Evaluation Performed:  Follow-Up Visit  Problem List  Chief Complaint  Patient presents with  . Atrial Fibrillation   Cody Whitney is a 68 yo gentleman with a hx of A-fib. He is a previous patient of Dr. Reyes Ivan and then Dr. Deborah Chalk. He walks regularly at work. He has no limitations doing his yardwork. 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 He's not had any episodes of chest pain or shortness breath.  Mar 29, 2013:  Cody Whitney is doing well. He is busy taking care of his wife who has cancer. He has not been exercising much because of his busy schedule  April 01, 2014:  Cody Whitney is doing ok.      Cody Whitney is a 68 y.o. male who presents for further eval of his atrial fib.  He is 68 years old and is on aspirin alone. He's on metoprolol for his rate control. He remains completely asymptomatic. He's able to do all of his normal activities without any significant problems.  May 15 ,2017:  Cody Whitney is doing well.   Has well controlled atrial fib  CHADS2VASC is 1 ( age)  No HTN Very active around the  house- just retired several weeks ago   Mar 21, 2017:  Cody Whitney is doing well .  Seen for follow up of his atrial fib  Has joined [employer] .  Has chronic AF  - CHADS2VASC is 1 ( age)   Mar 22, 2018  Doing well. CHADs2VASC is 1 age - no hx of HTN  Works out some . limite by migraine headaches , is on Imitrex which helps       Mar 22, 2019    Chief Complaint:  Atrial fib  - CHADS2VASC = 1 ( age)   Cody Whitney is a 68 y.o. male with atrial fib  No CP , dyspnea, presyncope  Getting some regular exercise.      Social distancing, wears his mask  The patient does not have symptoms concerning for COVID-19 infection (fever, chills, cough, or new shortness of breath).    Past Medical History:  Diagnosis Date  . Atrial fibrillation (HCC) 11/10/2011  . Headache    Past Surgical History:  Procedure Laterality Date  . FACIAL FRACTURE SURGERY    . TONSILLECTOMY       Current Meds  Medication Sig  . aspirin EC 81 MG EC tablet Take 1 tablet (81 mg total) by mouth daily.  . metoprolol succinate (TOPROL XL) 25 MG 24 hr tablet Take 1 tablet (25 mg total) by mouth daily.  . Multiple Vitamins-Minerals (CENTRUM SILVER PO)  Take 1 tablet by mouth daily.      Allergies:   Hydrocodone; Oxycontin [oxycodone]; and Tylenol [acetaminophen]   Social History   Tobacco Use  . Smoking status: Current Every Day Smoker    Packs/day: 0.50    Types: Cigarettes  . Smokeless tobacco: Never Used  Substance Use Topics  . Alcohol use: No    Comment: Last used 1980  . Drug use: No     Family Hx: The patient's family history includes Atrial fibrillation in his sister; Migraines in his sister. He was adopted.  ROS:   Please see the history of present illness.     All other systems reviewed and are negative.   Prior CV studies:   The following studies were reviewed today:    Labs/Other Tests and Data Reviewed:    EKG:  No ECG reviewed.  Recent Labs: No results found for  requested labs within last 8760 hours.   Recent Lipid Panel Lab Results  Component Value Date/Time   CHOL 214 (H) 11/05/2011 08:56 AM   TRIG 103.0 11/05/2011 08:56 AM   HDL 37.90 (L) 11/05/2011 08:56 AM   CHOLHDL 6 11/05/2011 08:56 AM   LDLDIRECT 159.9 11/05/2011 08:56 AM    Wt Readings from Last 3 Encounters:  03/22/19 221 lb (100.2 kg)  03/22/18 236 lb (107 kg)  07/28/17 228 lb 4 oz (103.5 kg)     Objective:    Vital Signs:  BP 115/80 (BP Location: Left Arm, Patient Position: Sitting, Cuff Size: Normal)   Pulse 79   Ht 6' (1.829 m)   Wt 221 lb (100.2 kg)   BMI 29.97 kg/m    VITAL SIGNS:  reviewed GEN:  no acute distress EYES:  sclerae anicteric, EOMI - Extraocular Movements Intact RESPIRATORY:  normal respiratory effort, symmetric expansion CARDIOVASCULAR:  no peripheral edema SKIN:  no rash, lesions or ulcers. MUSCULOSKELETAL:  no obvious deformities. NEURO:  alert and oriented x 3, no obvious focal deficit PSYCH:  normal affect  ASSESSMENT & PLAN:    1.  Atrial fib: Cody Whitney is doing well.  He has persistent atrial fibrillation.  His chads vas score is 1 ( age).  Continue ASA  His heart rate and blood pressure are well controlled.  He really cannot tell that his heart rate is irregular.  I will see him again in 1 year for follow-up visit.  He will call us for further questions or problems.    COVID-19 Education: The signs and symptoms of COVID-19 were discussed with the patient and how to seek care for testing (follow up with PCP or arrange E-visit).  The importance of social distancing was discussed today.  Time:   Today, I have spent 16  minutes with the patient with telehealth technology discussing the above problems.     Medication Adjustments/Labs and Tests Ordered: Current medicines are reviewed at length with the patient today.  Concerns regarding medicines are outlined above.   Tests Ordered: No orders of the defined types were placed in this  encounter.   Medication Changes: No orders of the defined types were placed in this encounter.   Disposition:  Follow up in 1 year(s)  Signed, Kristeen MissPhilip Reshma Hoey, MD  03/22/2019 9:42 AM    [facility]

## 2019-03-22 ENCOUNTER — Encounter: Payer: Self-pay | Admitting: Cardiovascular Disease

## 2019-03-22 ENCOUNTER — Telehealth (INDEPENDENT_AMBULATORY_CARE_PROVIDER_SITE_OTHER): Payer: Medicare Other | Admitting: Cardiovascular Disease

## 2019-03-22 ENCOUNTER — Other Ambulatory Visit: Payer: Self-pay

## 2019-03-22 VITALS — BP 115/80 | HR 79 | Ht 72.0 in | Wt 221.0 lb

## 2019-03-22 DIAGNOSIS — I4819 Other persistent atrial fibrillation: Secondary | ICD-10-CM

## 2019-03-22 DIAGNOSIS — Z7189 Other specified counseling: Secondary | ICD-10-CM

## 2019-03-22 NOTE — Patient Instructions (Signed)
Medication Instructions:  Your physician recommends that you continue on your current medications as directed. Please refer to the Current Medication list given to you today.  If you need a refill on your cardiac medications before your next appointment, please call your pharmacy.   Lab work: None Ordered    Testing/Procedures: None Ordered   Follow-Up: At [facility], you and your health needs are our priority.  As part of our continuing mission to provide you with exceptional heart care, we have created designated Provider Care Teams.  These Care Teams include your primary Cardiologist (physician) and Advanced Practice Providers (APPs -  Physician Assistants and Nurse Practitioners) who all work together to provide you with the care you need, when you need it. You will need a follow up appointment in:  1 years.  Please call our office 2 months in advance to schedule this appointment.  You may see Dr. Nahser or one of the following Advanced Practice Providers on your designated Care Team: Scott Weaver, PA-C Vin Bhagat, PA-C . Janine Hammond, NP    

## 2019-05-08 ENCOUNTER — Other Ambulatory Visit: Payer: Self-pay | Admitting: Cardiovascular Disease

## 2019-05-08 DIAGNOSIS — I4819 Other persistent atrial fibrillation: Secondary | ICD-10-CM

## 2019-12-02 ENCOUNTER — Ambulatory Visit: Payer: Medicare Other

## 2019-12-07 ENCOUNTER — Ambulatory Visit: Payer: Medicare Other

## 2019-12-08 ENCOUNTER — Ambulatory Visit: Payer: Medicare Other | Attending: Internal Medicine

## 2019-12-08 DIAGNOSIS — Z23 Encounter for immunization: Secondary | ICD-10-CM

## 2019-12-08 NOTE — Progress Notes (Signed)
   Covid-19 Vaccination Clinic  Name:  Cody Whitney    MRN: [id number] DOB: Jun 28, 1951  12/08/2019  [name] was observed post Covid-19 immunization for 15 minutes without incidence. He was provided with Vaccine Information Sheet and instruction to access the V-Safe system.   [name] was instructed to call 911 with any severe reactions post vaccine: Marland Kitchen Difficulty breathing  . Swelling of your face and throat  . A fast heartbeat  . A bad rash all over your body  . Dizziness and weakness    Immunizations Administered    Name Date Dose VIS Date Route   Pfizer COVID-19 Vaccine 12/08/2019 11:40 AM 0.3 mL 10/12/2019 Intramuscular   Manufacturer: [employer], [employer]   Lot: [id number]   NDC: [address]-4

## 2020-01-02 ENCOUNTER — Ambulatory Visit: Payer: Medicare Other | Attending: Internal Medicine

## 2020-01-02 DIAGNOSIS — Z23 Encounter for immunization: Secondary | ICD-10-CM | POA: Insufficient documentation

## 2020-01-02 NOTE — Progress Notes (Signed)
   Covid-19 Vaccination Clinic  Name:  Cody Whitney    MRN: [id number] DOB: 11/18/1950  01/02/2020  Cody Whitney was observed post Covid-19 immunization for 15 minutes without incident. He was provided with Vaccine Information Sheet and instruction to access the V-Safe system.   Cody Whitney was instructed to call 911 with any severe reactions post vaccine: Marland Kitchen Difficulty breathing  . Swelling of face and throat  . A fast heartbeat  . A bad rash all over body  . Dizziness and weakness   Immunizations Administered    Name Date Dose VIS Date Route   Pfizer COVID-19 Vaccine 01/02/2020  8:29 AM 0.3 mL 10/12/2019 Intramuscular   Manufacturer: [employer], [employer]   Lot: [id number]   NDC: [address]-4

## 2020-01-31 ENCOUNTER — Other Ambulatory Visit: Payer: Self-pay | Admitting: Cardiovascular Disease

## 2020-01-31 DIAGNOSIS — I4819 Other persistent atrial fibrillation: Secondary | ICD-10-CM

## 2020-03-27 ENCOUNTER — Encounter: Payer: Self-pay | Admitting: Cardiovascular Disease

## 2020-03-27 ENCOUNTER — Other Ambulatory Visit: Payer: Self-pay

## 2020-03-27 ENCOUNTER — Ambulatory Visit: Payer: Medicare Other | Admitting: Cardiovascular Disease

## 2020-03-27 VITALS — BP 136/66 | HR 64 | Ht 72.0 in | Wt 221.0 lb

## 2020-03-27 DIAGNOSIS — I4819 Other persistent atrial fibrillation: Secondary | ICD-10-CM

## 2020-03-27 DIAGNOSIS — E785 Hyperlipidemia, unspecified: Secondary | ICD-10-CM | POA: Diagnosis not present

## 2020-03-27 DIAGNOSIS — I10 Essential (primary) hypertension: Secondary | ICD-10-CM

## 2020-03-27 DIAGNOSIS — E782 Mixed hyperlipidemia: Secondary | ICD-10-CM | POA: Insufficient documentation

## 2020-03-27 MED ORDER — METOPROLOL SUCCINATE ER 25 MG PO TB24: 25.0000 mg | ORAL_TABLET | Freq: Every day | ORAL | 3 refills | Status: DC

## 2020-03-27 NOTE — Patient Instructions (Signed)

## 2020-03-27 NOTE — Progress Notes (Signed)
Cardiology Office Note   Date:  03/27/2020   ID:  Cody Whitney, DOB 02/13/1951, MRN [id number]  PCP:  Catha Gosselin, MD  Cardiologist:   Kristeen Miss, MD   Chief Complaint  Patient presents with  . Atrial Fibrillation   Cody Whitney is a 69 yo gentleman with a hx of A-fib. He is a previous patient of Dr. Reyes Ivan and then Dr. Deborah Chalk. He walks regularly at work. He has no limitations doing his yardwork. 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 He's not had any episodes of chest pain or shortness breath.  Mar 29, 2013:  Cody Whitney is doing well. He is busy taking care of his wife who has cancer. He has not been exercising much because of his busy schedule  April 01, 2014:  Cody Whitney is doing ok.      Cody Whitney is a 69 y.o. male who presents for further eval of his atrial fib.  He is 69 years old and is on aspirin alone. He's on metoprolol for his rate control. He remains completely asymptomatic. He's able to do all of his normal activities without any significant problems.  May 15 ,2017:  Cody Whitney is doing well.   Has well controlled atrial fib  CHADS2VASC is 1 ( age)  No HTN Very active around the house- just retired several weeks ago   Mar 21, 2017:  Cody Whitney is doing well .  Seen for follow up of his atrial fib  Has joined [employer] .  Has chronic AF  - CHADS2VASC is 1 ( age)   Mar 22, 2018  Doing well. CHADs2VASC is 1 age - no hx of HTN  Works out some . limite by migraine headaches , is on Imitrex which helps   Mar 27, 2020 Cody Whitney is seen today for follow up of his atrial fib CHADS2VASC is 1 ( age)  Did a telemedicine visit last year.   Walking some .   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  Has had both covid vaccines.  Lipids are mildly elevated.   Does not want to start a  Statin   Past Medical History:  Diagnosis Date  . Atrial fibrillation (HCC) 11/10/2011  .  Headache     Past Surgical History:  Procedure Laterality Date  . FACIAL FRACTURE SURGERY    . TONSILLECTOMY       Current Outpatient Medications  Medication Sig Dispense Refill  . aspirin EC 81 MG EC tablet Take 1 tablet (81 mg total) by mouth daily.    . metoprolol succinate (TOPROL-XL) 25 MG 24 hr tablet Take 1 tablet (25 mg total) by mouth daily. 90 tablet 3  . Multiple Vitamins-Minerals (CENTRUM SILVER PO) Take 1 tablet by mouth daily.      No current facility-administered medications for this visit.    Allergies:   Hydrocodone, Oxycontin [oxycodone], and Tylenol [acetaminophen]    Social History:  The patient  reports that he has been smoking cigarettes. He has been smoking about 0.50 packs per day. He has never used smokeless tobacco. He reports that he does not drink alcohol or use drugs.   Family History:  The patient's family history includes Atrial fibrillation in his sister; Migraines in his sister. He was adopted.    ROS:  Please see the history of present illness.     Physical Exam: Blood pressure 136/66, pulse 64, height 6' (1.829 m), weight  221 lb (100.2 kg), SpO2 97 %.  GEN:  Well nourished, well developed in no acute distress HEENT: Normal NECK: No JVD; No carotid bruits LYMPHATICS: No lymphadenopathy CARDIAC: irreg. Irreg.  RESPIRATORY:  Clear to auscultation without rales, wheezing or rhonchi  ABDOMEN: Soft, non-tender, non-distended MUSCULOSKELETAL:  No edema; No deformity  SKIN: Warm and dry NEUROLOGIC:  Alert and oriented x 3   EKG:   Mar 27, 2020:   Atrial fib with controlled V response.  No changes from his previous EKG.   Recent Labs: No results found for requested labs within last 8760 hours.    Lipid Panel    Component Value Date/Time   CHOL 214 (H) 11/05/2011 0856   TRIG 103.0 11/05/2011 0856   HDL 37.90 (L) 11/05/2011 0856   CHOLHDL 6 11/05/2011 0856   VLDL 20.6 11/05/2011 0856   LDLDIRECT 159.9 11/05/2011 0856      Wt  Readings from Last 3 Encounters:  03/27/20 221 lb (100.2 kg)  03/22/19 221 lb (100.2 kg)  03/22/18 236 lb (107 kg)      Other studies Reviewed: Additional studies/ records that were reviewed today include: . Review of the above records demonstrates:    ASSESSMENT AND PLAN:  1.  Atrial Fibrillation:    This patients CHA2DS2-VASc Score is 2  ( age, borderline HTN) .  He remains on aspirin.  We had a long discussion about starting Eliquis.  He does not want to start Eliquis.  He has borderline hypertension.  I told him that he should be diligent with on diet, exercise, weight loss to help reduce his blood pressure and help reduce his risk of stroke.  2.  Borderline hypertension: I encouraged him to continue to work on diet, exercise and weight loss.  3.  Hyperlipidemia: His LDL is 145.  He does not want to start a statin.  He will continue to discuss this with his primary medical doctor.     Current medicines are reviewed at length with the patient today.  The patient does not have concerns regarding medicines.  The following changes have been made:  no change  Labs/ tests ordered today include:   Orders Placed This Encounter  Procedures  . EKG 12-Lead     Disposition:   FU with me in 1 year .     Mertie Moores, MD  03/27/2020 8:48 AM    [facility] [address], [place], [place]  [address] Phone: [phone]; Fax: [phone]

## 2020-10-02 ENCOUNTER — Other Ambulatory Visit: Payer: Self-pay | Admitting: *Deleted

## 2020-10-02 DIAGNOSIS — F17210 Nicotine dependence, cigarettes, uncomplicated: Secondary | ICD-10-CM

## 2020-10-02 DIAGNOSIS — Z87891 Personal history of nicotine dependence: Secondary | ICD-10-CM

## 2020-11-05 ENCOUNTER — Other Ambulatory Visit: Payer: Self-pay

## 2020-11-05 ENCOUNTER — Ambulatory Visit
Admission: RE | Admit: 2020-11-05 | Discharge: 2020-11-05 | Disposition: A | Discharge status: Home / Self Care | Payer: Medicare Other | Source: Ambulatory Visit | Attending: Acute Care | Admitting: Acute Care

## 2020-11-05 ENCOUNTER — Telehealth: Payer: Self-pay | Admitting: Acute Care

## 2020-11-05 ENCOUNTER — Encounter: Payer: Self-pay | Admitting: Acute Care

## 2020-11-05 ENCOUNTER — Ambulatory Visit (INDEPENDENT_AMBULATORY_CARE_PROVIDER_SITE_OTHER): Payer: Medicare Other | Admitting: Acute Care

## 2020-11-05 VITALS — BP 118/80 | HR 84 | Temp 97.8°F | Ht 72.0 in | Wt 228.0 lb

## 2020-11-05 DIAGNOSIS — F17210 Nicotine dependence, cigarettes, uncomplicated: Secondary | ICD-10-CM

## 2020-11-05 DIAGNOSIS — I2510 Atherosclerotic heart disease of native coronary artery without angina pectoris: Secondary | ICD-10-CM | POA: Diagnosis not present

## 2020-11-05 DIAGNOSIS — Z87891 Personal history of nicotine dependence: Secondary | ICD-10-CM

## 2020-11-05 DIAGNOSIS — J432 Centrilobular emphysema: Secondary | ICD-10-CM | POA: Diagnosis not present

## 2020-11-05 DIAGNOSIS — M47814 Spondylosis without myelopathy or radiculopathy, thoracic region: Secondary | ICD-10-CM | POA: Diagnosis not present

## 2020-11-05 DIAGNOSIS — Z122 Encounter for screening for malignant neoplasm of respiratory organs: Secondary | ICD-10-CM

## 2020-11-05 IMAGING — CT CT CHEST LUNG CANCER SCREENING LOW DOSE W/O CM
1 of 3 series · 10 of 40 positions shown, 13 images · non-contrast
Comparison: [DATE] chest CT angiogram.

CLINICAL DATA: 69-year-old asymptomatic male current smoker with 38
pack-year smoking history.

EXAM:
CT CHEST WITHOUT CONTRAST LOW-DOSE FOR LUNG CANCER SCREENING
TECHNIQUE: Multidetector CT imaging of the chest was performed following the
standard protocol without IV contrast.

[ct lung segmentation data · axial · 0.82mm/px · z∈[-338,-338]mm · 10 of 320 frames shown]
[frame 1/320  mediastinal]
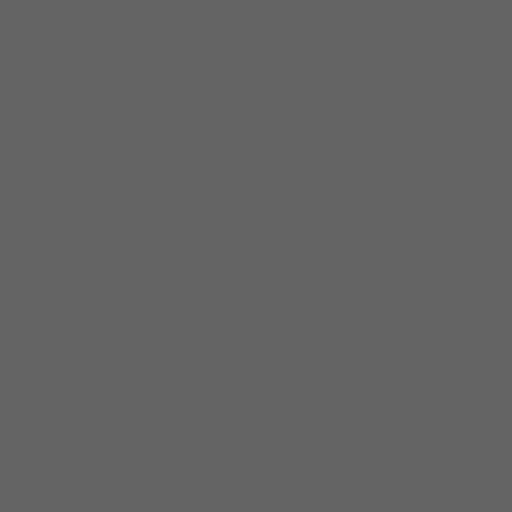
[frame 1/320  lung]
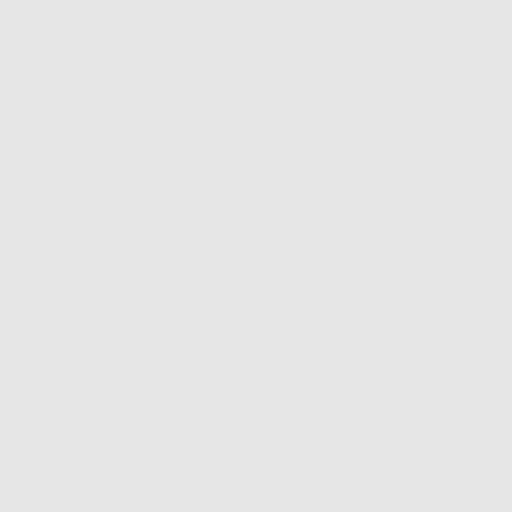
[frame 36/320  lung]
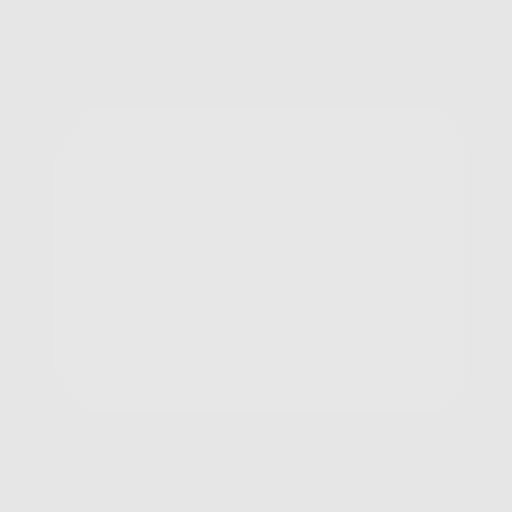
[frame 71/320  lung]
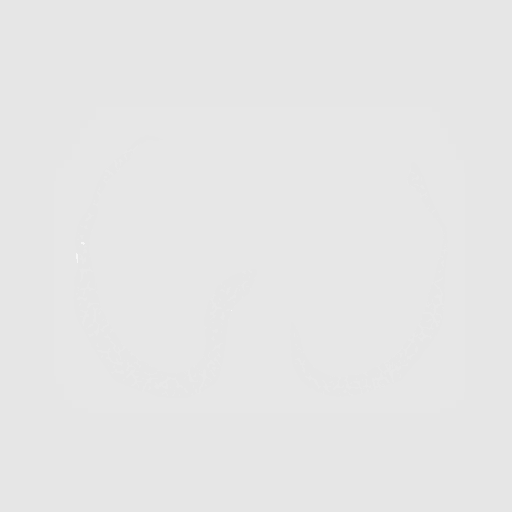
[frame 107/320  lung]
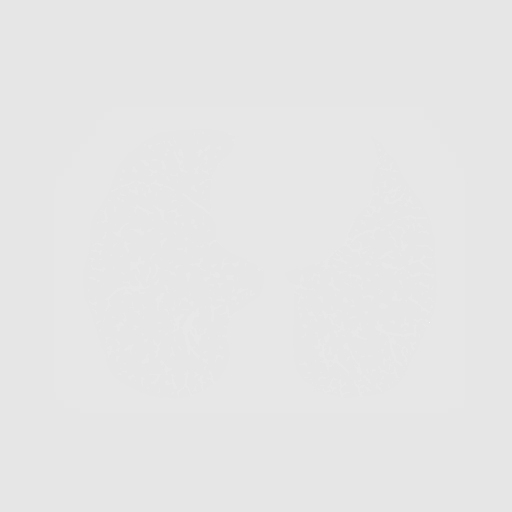
[frame 142/320  mediastinal]
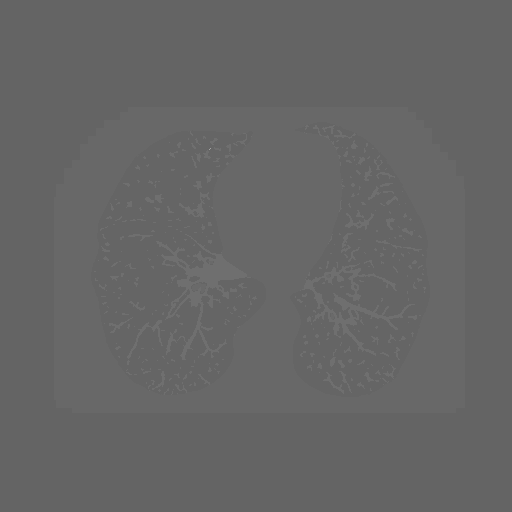
[frame 142/320  lung]
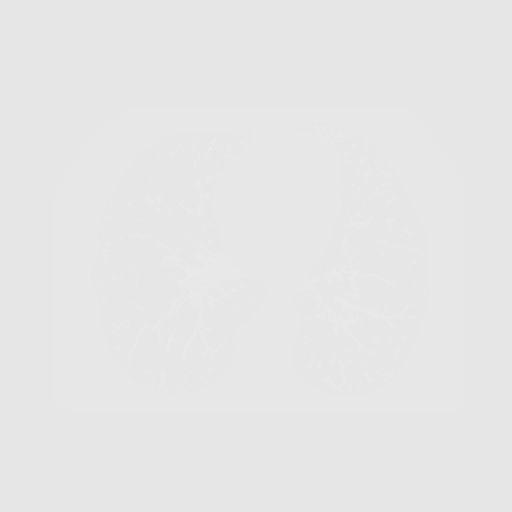
[frame 178/320  lung]
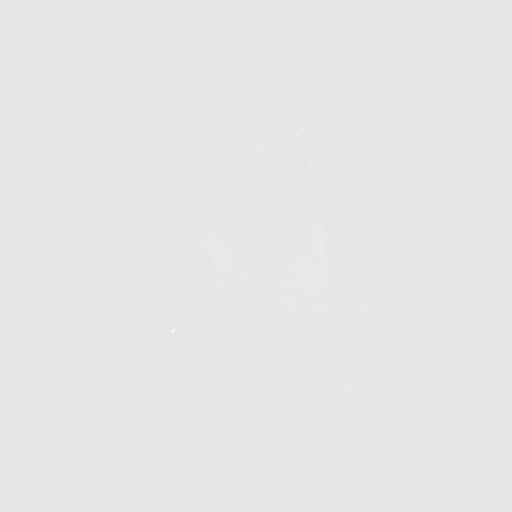
[frame 213/320  lung]
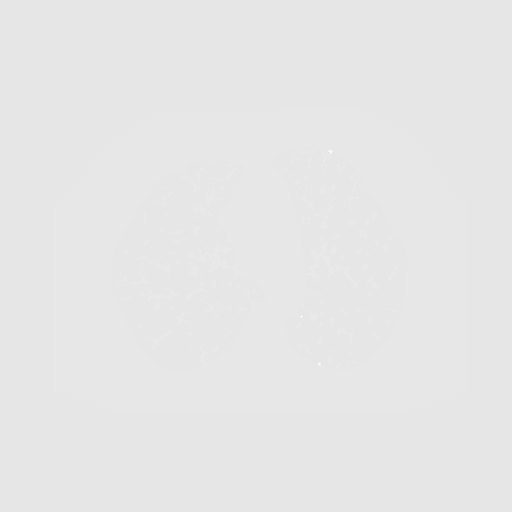
[frame 249/320  lung]
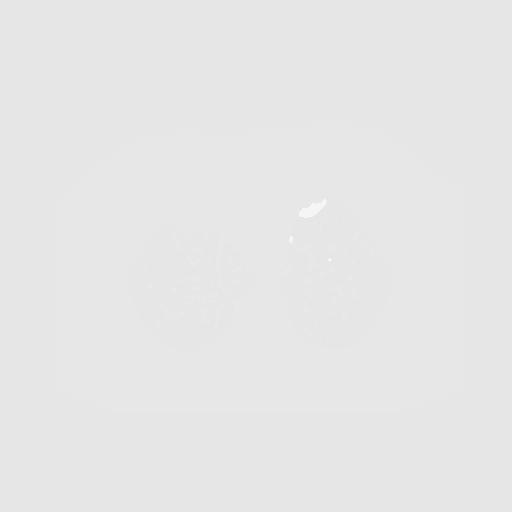
[frame 284/320  mediastinal]
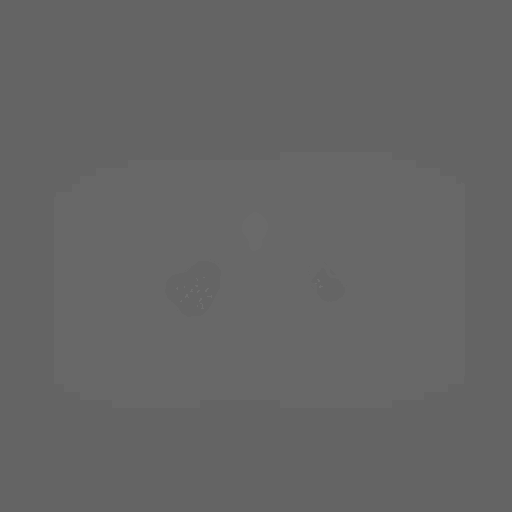
[frame 284/320  lung]
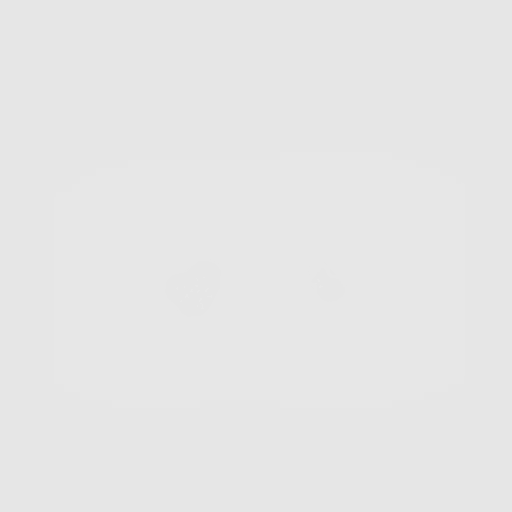
[frame 320/320  lung]
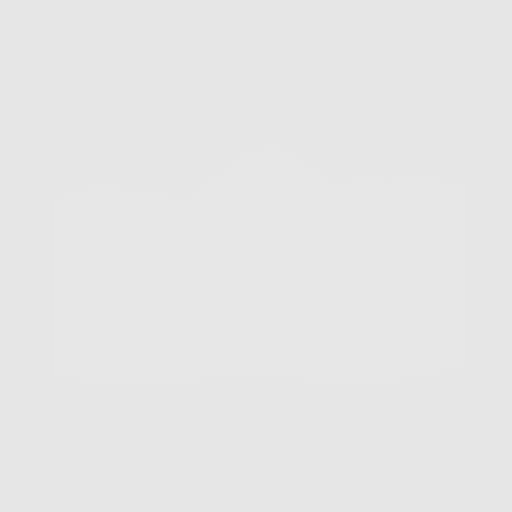

[10 of 40 positions shown; findings below may reference images not displayed]

FINDINGS: Cardiovascular: Normal heart size. No significant pericardial
effusion/thickening. Three-vessel coronary atherosclerosis.
Atherosclerotic nonaneurysmal thoracic aorta. Normal caliber
pulmonary arteries.

Mediastinum/Nodes: No discrete thyroid nodules. Unremarkable
esophagus. No pathologically enlarged axillary, mediastinal or hilar
lymph nodes, noting limited sensitivity for the detection of hilar
adenopathy on this noncontrast study.

Lungs/Pleura: No pneumothorax. No pleural effusion. Mild
centrilobular emphysema. No acute consolidative airspace disease or
lung masses. Several solid pulmonary nodules scattered in the lungs
bilaterally, largest 8.6 mm in volume derived mean diameter in the
anterior left lower lobe along the major fissure (series 3/image
159), which has increased from 0.6 cm to 1.0 cm in maximum diameter
since [DATE] chest CT.

Upper abdomen: No acute abnormality.

Musculoskeletal: No aggressive appearing focal osseous lesions.
Moderate thoracic spondylosis.
IMPRESSION: 1. Lung-RADS 4A, suspicious. Dominant 8.6 mm anterior left lower
lobe pulmonary nodule, increased since [WM] chest CT, more likely
benign given slow growth. Follow up low-dose chest CT without
contrast in 3 months (please use the following order, "CT CHEST LCS
NODULE FOLLOW-UP W/O CM") is recommended.
2. Three-vessel coronary atherosclerosis.
3. Aortic Atherosclerosis ([WM]-[WM]) and Emphysema ([WM]-[WM]).

These results will be called to the ordering clinician or
representative by the Radiologist Assistant, and communication
documented in the PACS or [REDACTED].

## 2020-11-05 NOTE — Telephone Encounter (Signed)
We had this sent to [place] and Maralyn Sago said you all would take care of it.  Sending it your way.  Thanks.

## 2020-11-05 NOTE — Telephone Encounter (Signed)
This will be called to the patient through the screening program. Thanks so much

## 2020-11-05 NOTE — Telephone Encounter (Signed)
   11/05/20 3:50 PM Bevelyn Ngo, NP routed this conversation to [facility] Triage Will Bonnet, NP     11/05/20 3:50 PM Note This will be called to the patient through the screening program. Thanks so much

## 2020-11-05 NOTE — Patient Instructions (Signed)
Thank you for participating in the [facility]. It was our pleasure to meet you today. We will call you with the results of your scan within the next few days. Your scan will be assigned a Lung RADS category score by the physicians reading the scans.  This Lung RADS score determines follow up scanning.  See below for description of categories, and follow up screening recommendations. We will be in touch to schedule your follow up screening annually or based on recommendations of our providers. We will fax a copy of your scan results to your Primary Care Physician, or the physician who referred you to the program, to ensure they have the results. Please call the office if you have any questions or concerns regarding your scanning experience or results.  Our office number is [phone]. Please speak with Denise Phelps, RN. She is our Lung Cancer Screening RN. If she is unavailable when you call, please have the office staff send her a message. She will return your call at her earliest convenience. Remember, if your scan is normal, we will scan you annually as long as you continue to meet the criteria for the program. (Age 55-77, Current smoker or smoker who has quit within the last 15 years). If you are a smoker, remember, quitting is the single most powerful action that you can take to decrease your risk of lung cancer and other pulmonary, breathing related problems. We know quitting is hard, and we are here to help.  Please let us know if there is anything we can do to help you meet your goal of quitting. If you are a former smoker, congratulations. We are proud of you! Remain smoke free! Remember you can refer friends or family members through the number above.  We will screen them to make sure they meet criteria for the program. Thank you for helping us take better care of you by participating in Lung Screening.  Lung RADS Categories:  Lung RADS 1: no nodules  or definitely non-concerning nodules.  Recommendation is for a repeat annual scan in 12 months.  Lung RADS 2:  nodules that are non-concerning in appearance and behavior with a very low likelihood of becoming an active cancer. Recommendation is for a repeat annual scan in 12 months.  Lung RADS 3: nodules that are probably non-concerning , includes nodules with a low likelihood of becoming an active cancer.  Recommendation is for a 6-month repeat screening scan. Often noted after an upper respiratory illness. We will be in touch to make sure you have no questions, and to schedule your 6-month scan.  Lung RADS 4 A: nodules with concerning findings, recommendation is most often for a follow up scan in 3 months or additional testing based on our provider's assessment of the scan. We will be in touch to make sure you have no questions and to schedule the recommended 3 month follow up scan.  Lung RADS 4 B:  indicates findings that are concerning. We will be in touch with you to schedule additional diagnostic testing based on our provider's  assessment of the scan.   

## 2020-11-05 NOTE — Telephone Encounter (Signed)
Please see other encounter from today. Will close this encounter.

## 2020-11-05 NOTE — Progress Notes (Signed)
Shared Decision Making Visit Lung Cancer Screening Program [phone])   Eligibility:  Age 70 y.o.  Pack Years Smoking History Calculation 38.25 pack years (# packs/per year x # years smoked)  Recent History of coughing up blood  no  Unexplained weight loss? no ( >Than 15 pounds within the last 6 months )  Prior History Lung / other cancer no (Diagnosis within the last 5 years already requiring surveillance chest CT Scans).  Smoking Status Current Smoker  Former Smokers: Years since quit: NA  Quit Date: NA  Visit Components:  Discussion included one or more decision making aids. yes  Discussion included risk/benefits of screening. yes  Discussion included potential follow up diagnostic testing for abnormal scans. yes  Discussion included meaning and risk of over diagnosis. yes  Discussion included meaning and risk of False Positives. yes  Discussion included meaning of total radiation exposure. yes  Counseling Included:  Importance of adherence to annual lung cancer LDCT screening. yes  Impact of comorbidities on ability to participate in the program. yes  Ability and willingness to under diagnostic treatment. yes  Smoking Cessation Counseling:  Current Smokers:   Discussed importance of smoking cessation. yes  Information about tobacco cessation classes and interventions provided to patient. yes  Patient provided with "ticket" for LDCT Scan. yes  Symptomatic Patient. no  Counseling NA  Diagnosis Code: Tobacco Use Z72.0  Asymptomatic Patient yes  Counseling (Intermediate counseling: > three minutes counseling) [id number]  Former Smokers:   Discussed the importance of maintaining cigarette abstinence. yes  Diagnosis Code: Personal History of Nicotine Dependence. Y60.630  Information about tobacco cessation classes and interventions provided to patient. Yes  Patient provided with "ticket" for LDCT Scan. yes  Written Order for Lung Cancer Screening with  LDCT placed in Epic. Yes (CT Chest Lung Cancer Screening Low Dose W/O CM) [id number] Z12.2-Screening of respiratory organs Z87.891-Personal history of nicotine dependence  BP 118/80 (BP Location: Left Arm, Cuff Size: Normal)   Pulse 84   Temp 97.8 F (36.6 C) (Oral)   Ht 6' (1.829 m)   Wt 228 lb (103.4 kg)   SpO2 99%   BMI 30.92 kg/m    I have spent 25 minutes of face to face time with Mr. Selvidge discussing the risks and benefits of lung cancer screening. We viewed a power point together that explained in detail the above noted topics. We paused at intervals to allow for questions to be asked and answered to ensure understanding.We discussed that the single most powerful action that he can take to decrease his risk of developing lung cancer is to quit smoking. We discussed whether or not he is ready to commit to setting a quit date. We discussed options for tools to aid in quitting smoking including nicotine replacement therapy, non-nicotine medications, support groups, Quit Smart classes, and behavior modification. We discussed that often times setting smaller, more achievable goals, such as eliminating 1 cigarette a day for a week and then 2 cigarettes a day for a week can be helpful in slowly decreasing the number of cigarettes smoked. This allows for a sense of accomplishment as well as providing a clinical benefit. I gave him the " Be Stronger Than Your Excuses" card with contact information for community resources, classes, free nicotine replacement therapy, and access to mobile apps, text messaging, and on-line smoking cessation help. I have also given him my card and contact information in the event he needs to contact me. We discussed the time and location of  the scan, and that either Abigail Miyamoto RN or I will call with the results within 24-48 hours of receiving them. I have offered him  a copy of the power point we viewed  as a resource in the event they need reinforcement of the concepts we  discussed today in the office. The patient verbalized understanding of all of  the above and had no further questions upon leaving the office. They have my contact information in the event they have any further questions.  I spent 3 minutes counseling on smoking cessation and the health risks of continued tobacco abuse.  I explained to the patient that there has been a high incidence of coronary artery disease noted on these exams. I explained that this is a non-gated exam therefore degree or severity cannot be determined. This patient is not currently on statin therapy. I have asked the patient to follow-up with their PCP regarding any incidental finding of coronary artery disease and management with diet or medication as their PCP  feels is clinically indicated. The patient verbalized understanding of the above and had no further questions upon completion of the visit.      Bevelyn Ngo, NP 11/05/2020

## 2020-11-05 NOTE — Telephone Encounter (Signed)
Call report from [facility]. Sarah please advise  IMPRESSION: 1. Lung-RADS 4A, suspicious. Dominant 8.6 mm anterior left lower lobe pulmonary nodule, increased since 2005 chest CT, more likely benign given slow growth. Follow up low-dose chest CT without contrast in 3 months (please use the following order, "CT CHEST LCS NODULE FOLLOW-UP W/O CM") is recommended. 2. Three-vessel coronary atherosclerosis. 3. Aortic Atherosclerosis (ICD10-I70.0) and Emphysema (ICD10-J43.9).  These results will be called to the ordering clinician or representative by the Radiologist Assistant, and communication documented in the PACS or [employer].   Electronically Signed   By: Delbert Phenix M.D.   On: 11/05/2020 14:46

## 2020-11-06 NOTE — Telephone Encounter (Signed)
See result note dated 11/06/20.

## 2020-11-06 NOTE — Addendum Note (Signed)
Addended by: Abigail Miyamoto D on: 11/06/2020 02:52 PM   Modules accepted: Orders

## 2020-11-06 NOTE — Progress Notes (Signed)
I have called the patient with his CT results. I explained that there is a nodule that was noted on a CTA in 2005, that has grown over time. Radiologist feels this is benign, but we need to do a 3 month follow up to ensure stability. He is in agreement with this plan.  Angelique Blonder, please fax results to PCP and order 3 month follow up CT. Thanks so much Synetta Fail, please schedule and pre cert. Thanks so much!!

## 2020-12-25 DIAGNOSIS — H2513 Age-related nuclear cataract, bilateral: Secondary | ICD-10-CM | POA: Diagnosis not present

## 2020-12-25 DIAGNOSIS — H5203 Hypermetropia, bilateral: Secondary | ICD-10-CM | POA: Diagnosis not present

## 2021-02-04 ENCOUNTER — Ambulatory Visit
Admission: RE | Admit: 2021-02-04 | Discharge: 2021-02-04 | Disposition: A | Discharge status: Home / Self Care | Payer: Medicare Other | Source: Ambulatory Visit | Attending: Acute Care | Admitting: Acute Care

## 2021-02-04 DIAGNOSIS — F17210 Nicotine dependence, cigarettes, uncomplicated: Secondary | ICD-10-CM

## 2021-02-04 DIAGNOSIS — Z87891 Personal history of nicotine dependence: Secondary | ICD-10-CM

## 2021-02-04 IMAGING — CT CT CHEST LCS NODULE FOLLOW-UP W/O CM
1 of 2 series · 15 of 40 positions shown, 19 images · non-contrast
Comparison: [DATE]

CLINICAL DATA: 69-year-old male with 30 pack-year history of
smoking. Lung cancer screening.

EXAM:
CT CHEST WITHOUT CONTRAST FOR LUNG CANCER SCREENING NODULE FOLLOW-UP
TECHNIQUE: Multidetector CT imaging of the chest was performed following the
standard protocol without IV contrast.

[Series 2: lcs nodule f/u · axial · 0.85mm/px · z∈[+809,+1114]mm · 15 of 67 slices shown, 19 images]
[im 3/67  mediastinal]
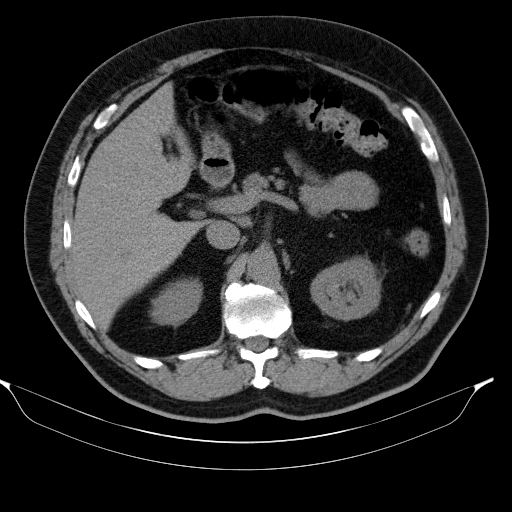
[im 3/67  lung]
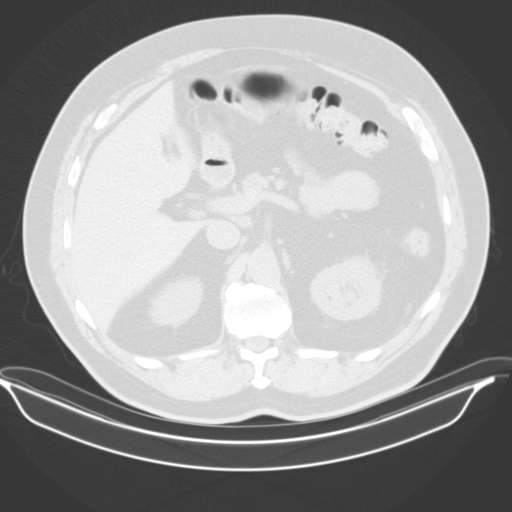
[im 8/67  lung]
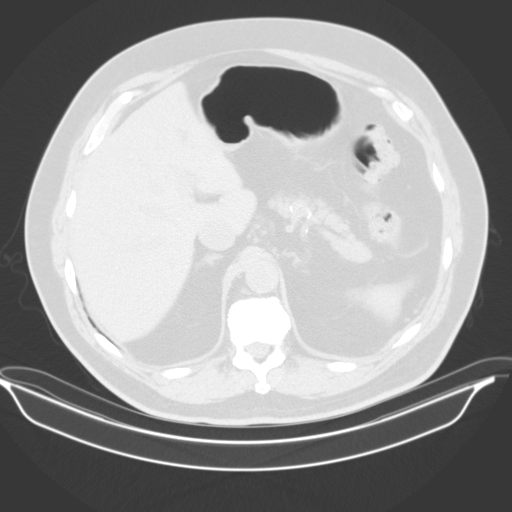
[im 13/67  lung]
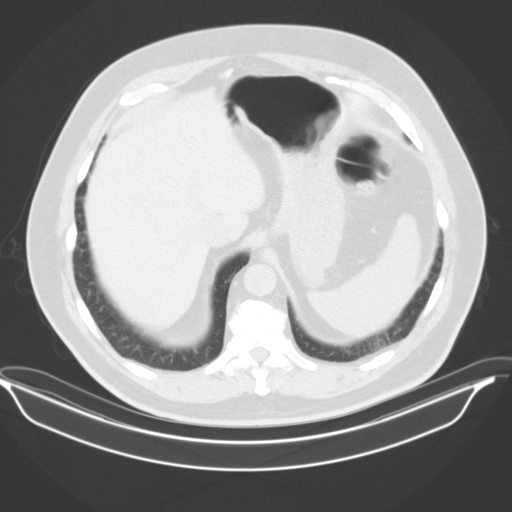
[im 17/67  lung]
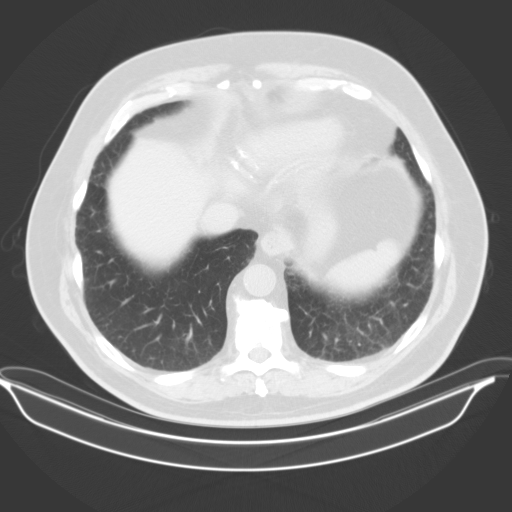
[im 21/67  mediastinal]
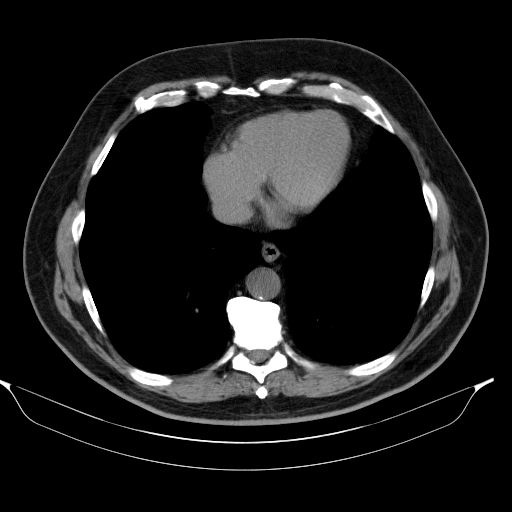
[im 21/67  lung]
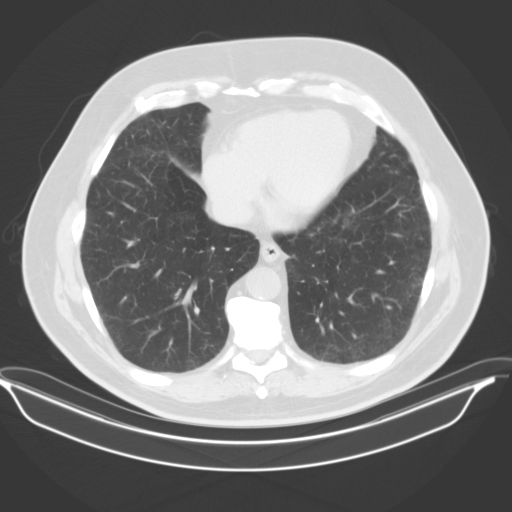
[im 26/67  lung]
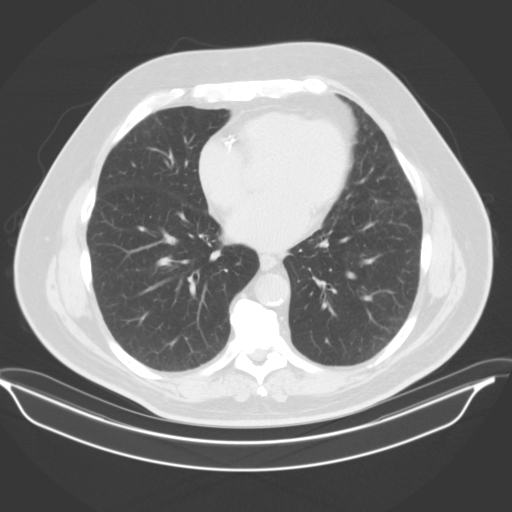
[im 31/67  lung]
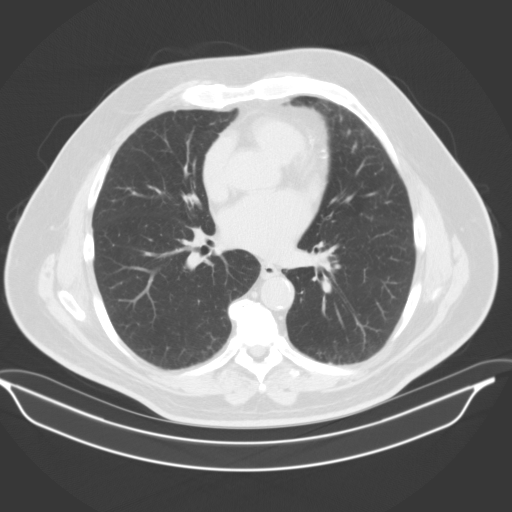
[im 34/67  lung]
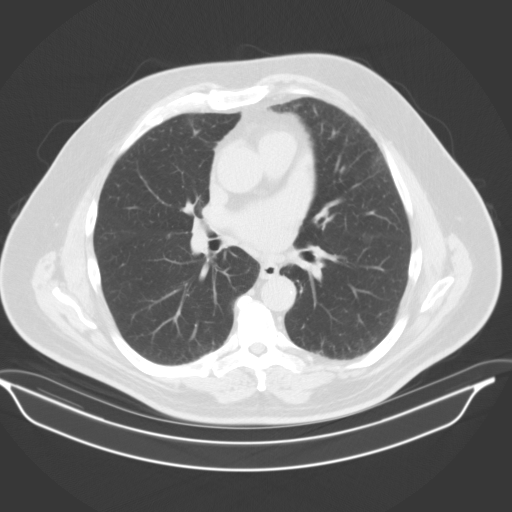
[im 36/67  mediastinal]
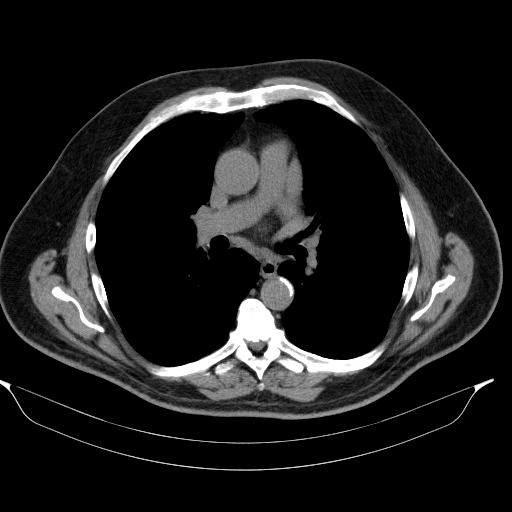
[im 36/67  lung]
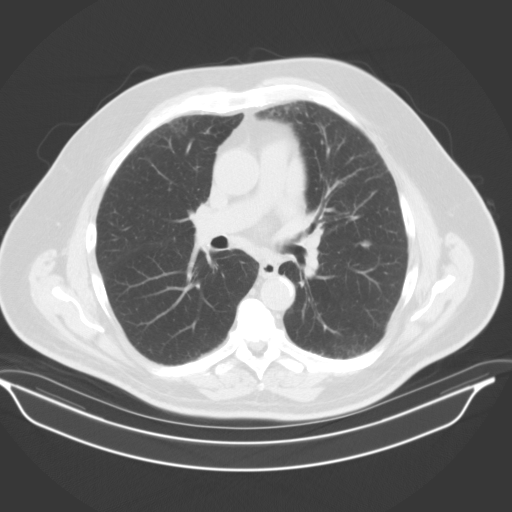
[im 41/67  lung]
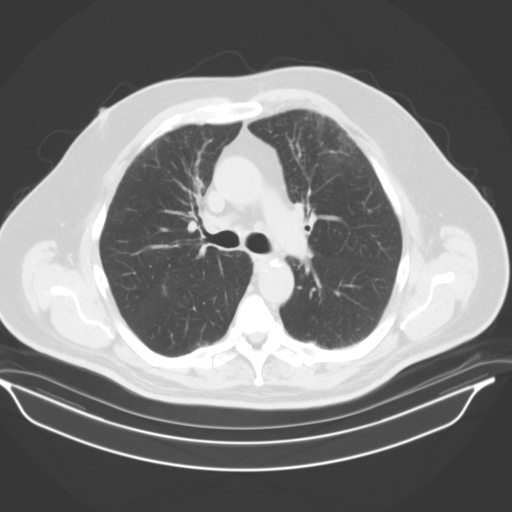
[im 46/67  lung]
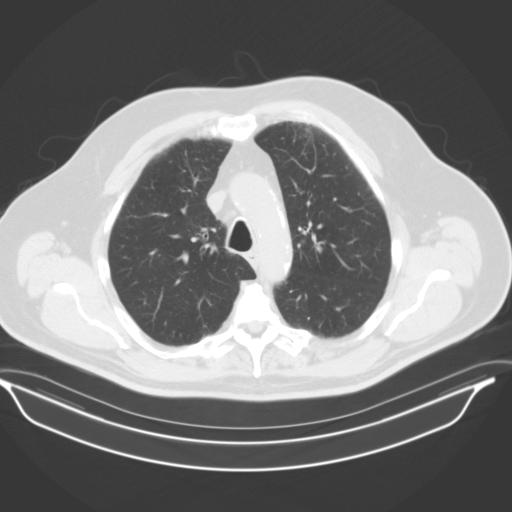
[im 50/67  lung]
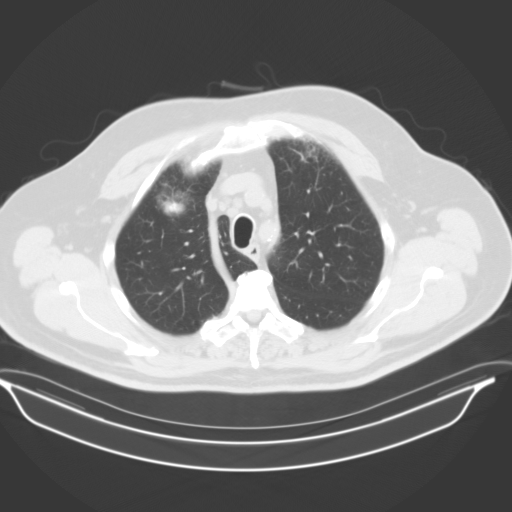
[im 54/67  mediastinal]
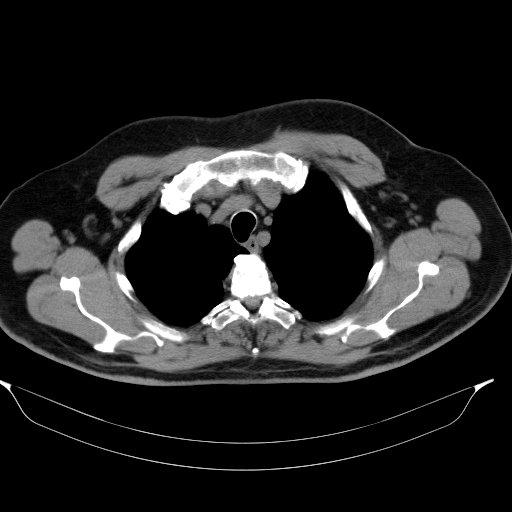
[im 54/67  lung]
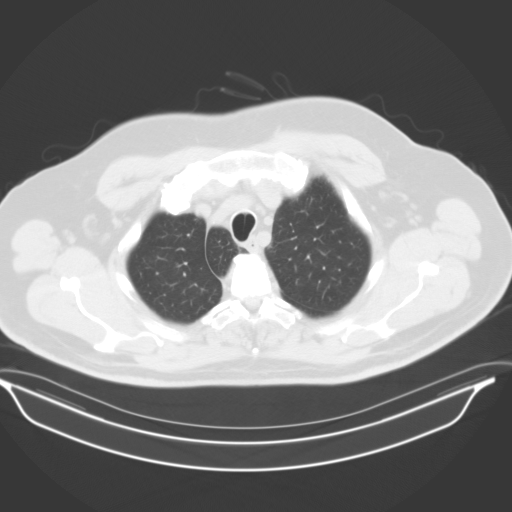
[im 59/67  lung]
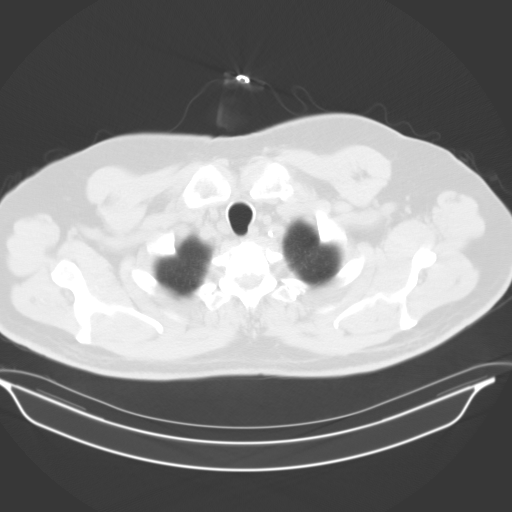
[im 64/67  lung]
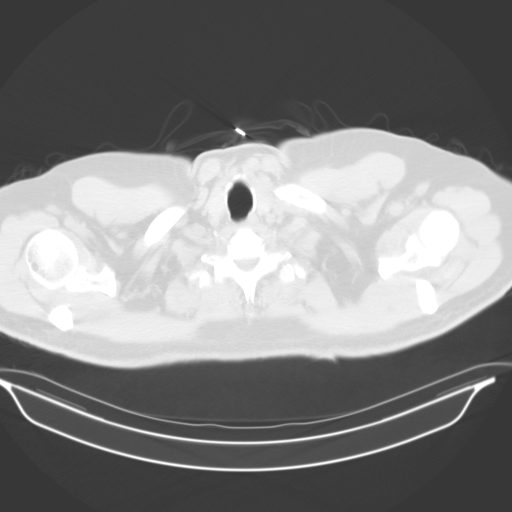

[15 of 40 positions shown; findings below may reference images not displayed]

FINDINGS: Cardiovascular: The heart size is normal. No substantial pericardial
effusion. Coronary artery calcification is evident. Atherosclerotic
calcification is noted in the wall of the thoracic aorta.

Mediastinum/Nodes: Scattered small and upper normal mediastinal
lymph nodes are stable compatible with reactive etiology. No
evidence for gross hilar lymphadenopathy although assessment is
limited by the lack of intravenous contrast on today's study. The
esophagus has normal imaging features. There is no axillary
lymphadenopathy.

Lungs/Pleura: Centrilobular emphsyema noted. Previously identified
scattered tiny calcified and noncalcified pulmonary nodules are
stable. Dominant nodule of concern on the previous study in the
perifissural left lung is stable (image 160), measuring 8.5 mm today
compared to 8.6 mm previously. No new suspicious pulmonary nodule or
mass. No focal airspace consolidation. No pleural effusion.

Upper Abdomen: Although incompletely visualized, anterior interpolar
left kidney has a focal nodular character measuring 2.4 cm.

Musculoskeletal: No worrisome lytic or sclerotic osseous
abnormality.
IMPRESSION: 1. Lung-RADS 2, benign appearance or behavior. Continue annual
screening with low-dose chest CT without contrast in 12 months.
2. 2.4 cm nodular contour in the interpolar left kidney,
incompletely visualized. This region was not seen on previous chest
CT and while it may represent normal renal anatomy, given the focal
nodular/masslike character, renal ultrasound recommended to further
evaluate.
3.  Emphysema ([K2]-[K2]) and Aortic Atherosclerosis ([K2]-170.0)

## 2021-02-23 NOTE — Progress Notes (Signed)
I have called the patient with the results of his scan. I explained that his scan was read as a lung RADS 2, benign appearance or behavior.  I explained that there was notation of a 2.4 cm nodular contour in the interpolar left kidney that had previously not been seen on imaging in January 2022.  Recommendation is for a renal ultrasound.  I have called [facility] to let them know that they need to discuss follow-up of this area with a renal ultrasound.  I spoke with French Ana NP and she will forward this information to Meridee Score who the patient will be establishing with as PCP, as Dr. Clarene Duke has retired.  She will follow-up, and make sure  Meridee Score NP is aware and can order follow-up ultrasound .I told the patient I will be calling his primary care physician to ensure that they were aware of this recommendation.He verbalized understanding.   Angelique Blonder, please fax results to [facility], and schedule 12 month follow up CT. Thanks so much.

## 2021-02-24 ENCOUNTER — Other Ambulatory Visit: Payer: Self-pay | Admitting: *Deleted

## 2021-02-24 DIAGNOSIS — F17210 Nicotine dependence, cigarettes, uncomplicated: Secondary | ICD-10-CM

## 2021-02-24 DIAGNOSIS — Z87891 Personal history of nicotine dependence: Secondary | ICD-10-CM

## 2021-03-10 ENCOUNTER — Other Ambulatory Visit: Payer: Self-pay

## 2021-03-10 ENCOUNTER — Emergency Department (HOSPITAL_BASED_OUTPATIENT_CLINIC_OR_DEPARTMENT_OTHER): Payer: Medicare Other

## 2021-03-10 ENCOUNTER — Inpatient Hospital Stay (HOSPITAL_BASED_OUTPATIENT_CLINIC_OR_DEPARTMENT_OTHER)
Admission: EM | Admit: 2021-03-10 | Discharge: 2021-03-11 | DRG: 065 | Disposition: A | Discharge status: Home / Self Care | Payer: Medicare Other | Attending: Internal Medicine | Admitting: Internal Medicine

## 2021-03-10 ENCOUNTER — Encounter (HOSPITAL_BASED_OUTPATIENT_CLINIC_OR_DEPARTMENT_OTHER): Payer: Self-pay

## 2021-03-10 DIAGNOSIS — I10 Essential (primary) hypertension: Secondary | ICD-10-CM | POA: Diagnosis not present

## 2021-03-10 DIAGNOSIS — I482 Chronic atrial fibrillation, unspecified: Secondary | ICD-10-CM | POA: Diagnosis present

## 2021-03-10 DIAGNOSIS — I4891 Unspecified atrial fibrillation: Secondary | ICD-10-CM | POA: Diagnosis not present

## 2021-03-10 DIAGNOSIS — Z7982 Long term (current) use of aspirin: Secondary | ICD-10-CM

## 2021-03-10 DIAGNOSIS — R4701 Aphasia: Secondary | ICD-10-CM | POA: Diagnosis present

## 2021-03-10 DIAGNOSIS — Z885 Allergy status to narcotic agent status: Secondary | ICD-10-CM | POA: Diagnosis not present

## 2021-03-10 DIAGNOSIS — F17210 Nicotine dependence, cigarettes, uncomplicated: Secondary | ICD-10-CM | POA: Diagnosis present

## 2021-03-10 DIAGNOSIS — I63412 Cerebral infarction due to embolism of left middle cerebral artery: Secondary | ICD-10-CM | POA: Diagnosis not present

## 2021-03-10 DIAGNOSIS — Z79899 Other long term (current) drug therapy: Secondary | ICD-10-CM | POA: Diagnosis not present

## 2021-03-10 DIAGNOSIS — Z8249 Family history of ischemic heart disease and other diseases of the circulatory system: Secondary | ICD-10-CM

## 2021-03-10 DIAGNOSIS — E782 Mixed hyperlipidemia: Secondary | ICD-10-CM | POA: Diagnosis not present

## 2021-03-10 DIAGNOSIS — G43909 Migraine, unspecified, not intractable, without status migrainosus: Secondary | ICD-10-CM | POA: Diagnosis not present

## 2021-03-10 DIAGNOSIS — I63512 Cerebral infarction due to unspecified occlusion or stenosis of left middle cerebral artery: Secondary | ICD-10-CM | POA: Diagnosis not present

## 2021-03-10 DIAGNOSIS — I639 Cerebral infarction, unspecified: Secondary | ICD-10-CM

## 2021-03-10 DIAGNOSIS — Z20822 Contact with and (suspected) exposure to covid-19: Secondary | ICD-10-CM | POA: Diagnosis present

## 2021-03-10 DIAGNOSIS — I6782 Cerebral ischemia: Secondary | ICD-10-CM | POA: Diagnosis not present

## 2021-03-10 LAB — COMPREHENSIVE METABOLIC PANEL
ALT: 19 U/L (ref 0–44)
AST: 17 U/L (ref 15–41)
Albumin: 4 g/dL (ref 3.5–5.0)
Alkaline Phosphatase: 52 U/L (ref 38–126)
Anion gap: 10 (ref 5–15)
BUN: 15 mg/dL (ref 8–23)
CO2: 25 mmol/L (ref 22–32)
Calcium: 8.8 mg/dL — ABNORMAL LOW (ref 8.9–10.3)
Chloride: 104 mmol/L (ref 98–111)
Creatinine, Ser: 1.09 mg/dL (ref 0.61–1.24)
GFR, Estimated: >60 mL/min (ref >60)
Glucose, Bld: 130 mg/dL — ABNORMAL HIGH (ref 70–99)
Potassium: 3.7 mmol/L (ref 3.5–5.1)
Sodium: 139 mmol/L (ref 135–145)
Total Bilirubin: 0.4 mg/dL (ref 0.3–1.2)
Total Protein: 6.8 g/dL (ref 6.5–8.1)

## 2021-03-10 LAB — DIFFERENTIAL
Abs Immature Granulocytes: 0.06 10*3/uL (ref 0.00–0.07)
Basophils Absolute: 0.1 10*3/uL (ref 0.0–0.1)
Basophils Relative: 1 %
Eosinophils Absolute: 0.5 10*3/uL (ref 0.0–0.5)
Eosinophils Relative: 5 %
Immature Granulocytes: 1 %
Lymphocytes Relative: 25 %
Lymphs Abs: 2.6 10*3/uL (ref 0.7–4.0)
Monocytes Absolute: 1.5 10*3/uL — ABNORMAL HIGH (ref 0.1–1.0)
Monocytes Relative: 14 %
Neutro Abs: 5.9 10*3/uL (ref 1.7–7.7)
Neutrophils Relative %: 54 %

## 2021-03-10 LAB — URINALYSIS, ROUTINE W REFLEX MICROSCOPIC
Bilirubin Urine: NEGATIVE
Glucose, UA: NEGATIVE mg/dL
Ketones, ur: NEGATIVE mg/dL
Leukocytes,Ua: NEGATIVE
Nitrite: NEGATIVE
Protein, ur: NEGATIVE mg/dL
Specific Gravity, Urine: 1.019 (ref 1.005–1.030)
pH: 6 (ref 5.0–8.0)

## 2021-03-10 LAB — HEMOGLOBIN A1C
Hgb A1c MFr Bld: 6.1 % — ABNORMAL HIGH (ref 4.8–5.6)
Mean Plasma Glucose: 128.37 mg/dL

## 2021-03-10 LAB — CBC
HCT: 43.2 % (ref 39.0–52.0)
Hemoglobin: 14.8 g/dL (ref 13.0–17.0)
MCH: 32.5 pg (ref 26.0–34.0)
MCHC: 34.3 g/dL (ref 30.0–36.0)
MCV: 94.7 fL (ref 80.0–100.0)
Platelets: 306 10*3/uL (ref 150–400)
RBC: 4.56 MIL/uL (ref 4.22–5.81)
RDW: 13.3 % (ref 11.5–15.5)
WBC: 10.6 10*3/uL — ABNORMAL HIGH (ref 4.0–10.5)
nRBC: 0 % (ref 0.0–0.2)

## 2021-03-10 LAB — ETHANOL: Alcohol, Ethyl (B): <10 mg/dL (ref <10)

## 2021-03-10 LAB — PROTIME-INR
INR: 1.1 (ref 0.8–1.2)
Prothrombin Time: 13.9 seconds (ref 11.4–15.2)

## 2021-03-10 LAB — RAPID URINE DRUG SCREEN, HOSP PERFORMED
Amphetamines: NOT DETECTED
Barbiturates: NOT DETECTED
Benzodiazepines: NOT DETECTED
Cocaine: NOT DETECTED
Opiates: NOT DETECTED
Tetrahydrocannabinol: NOT DETECTED

## 2021-03-10 LAB — RESP PANEL BY RT-PCR (FLU A&B, COVID) ARPGX2
Influenza A by PCR: NEGATIVE
Influenza B by PCR: NEGATIVE
SARS Coronavirus 2 by RT PCR: NEGATIVE

## 2021-03-10 LAB — APTT: aPTT: 31 seconds (ref 24–36)

## 2021-03-10 IMAGING — CT CT HEAD W/O CM
4 series · 16 of 47 positions shown, 18 images · non-contrast
Comparison: [DATE]

CLINICAL DATA: Speech disturbance.

EXAM:
CT HEAD WITHOUT CONTRAST
TECHNIQUE: Contiguous axial images were obtained from the base of the skull
through the vertex without intravenous contrast.

[Series 2: head wo · axial · 0.47mm/px · z∈[-311,-191]mm · 7 of 34 slices shown, 9 images]
[im 5/34  brain]
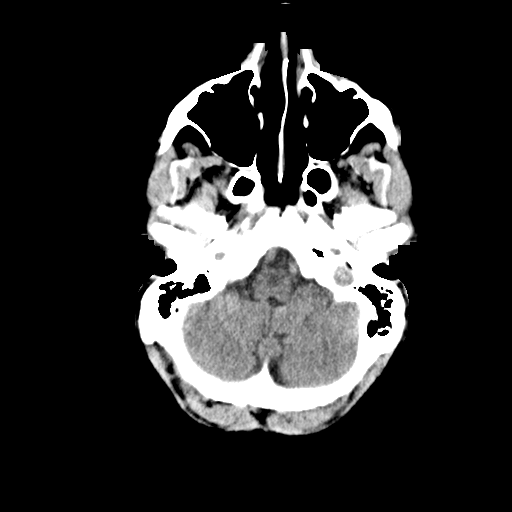
[im 5/34  bone]
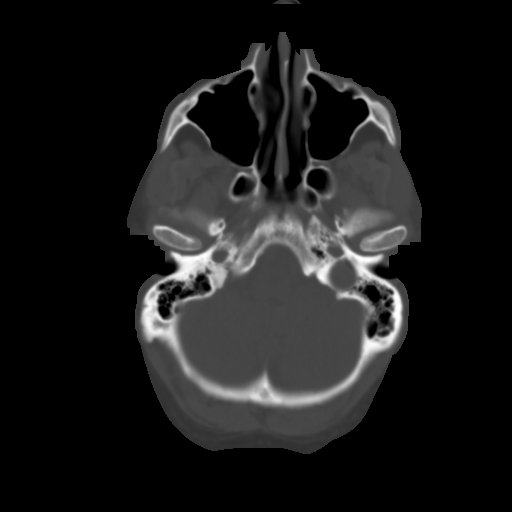
[im 9/34  brain]
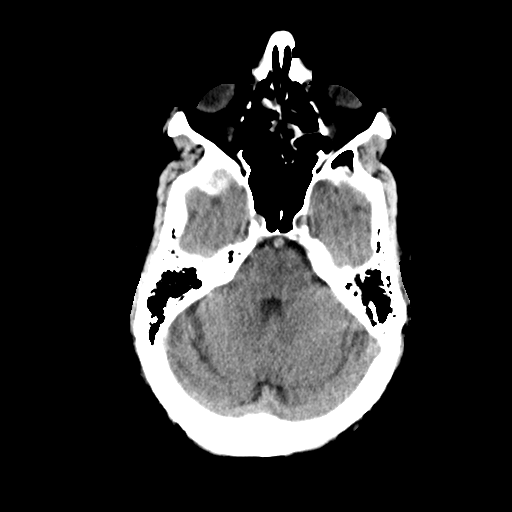
[im 13/34  brain]
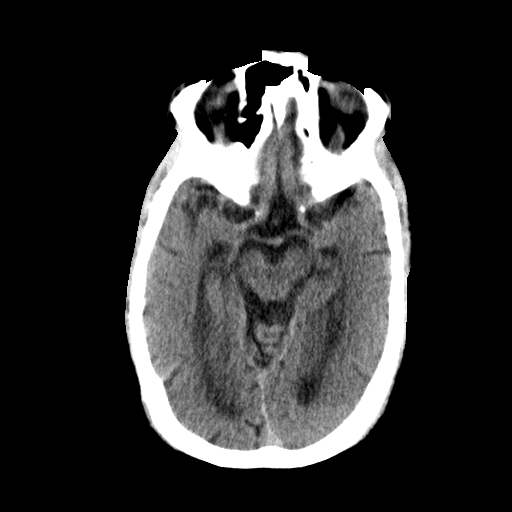
[im 17/34  brain]
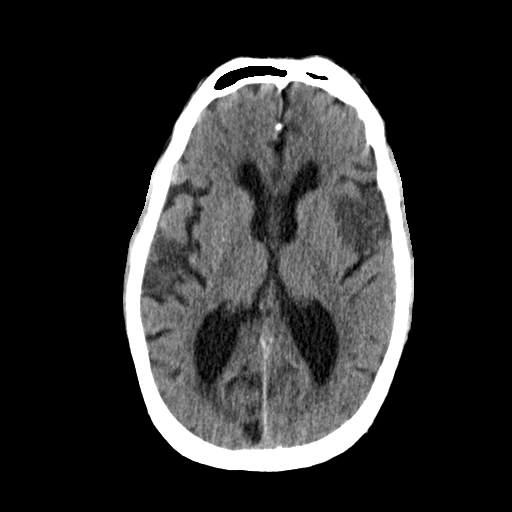
[im 21/34  brain]
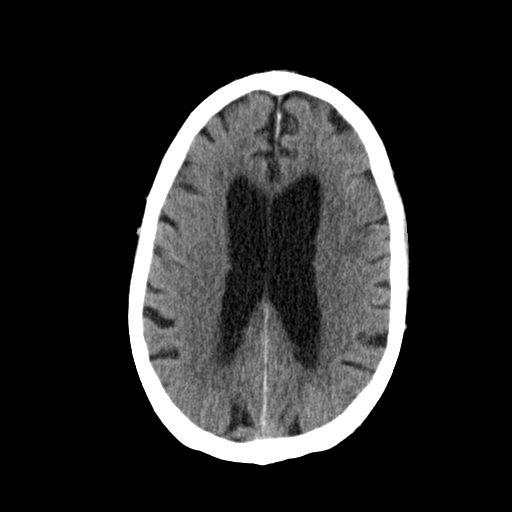
[im 21/34  bone]
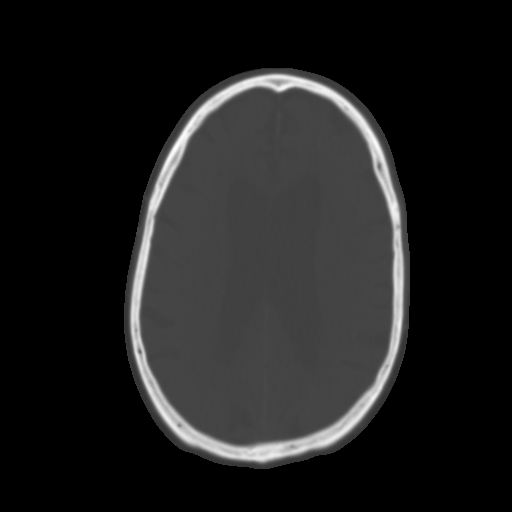
[im 25/34  brain]
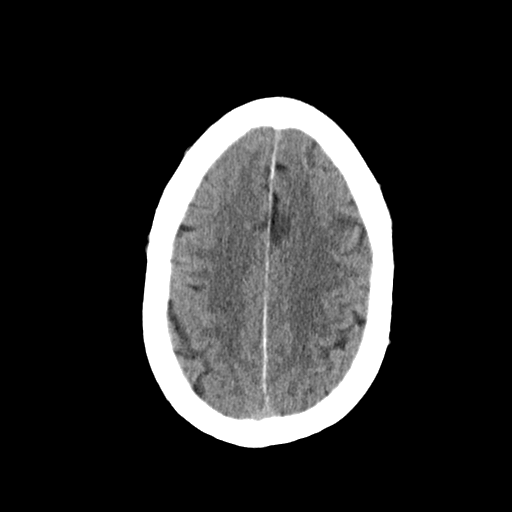
[im 29/34  brain]
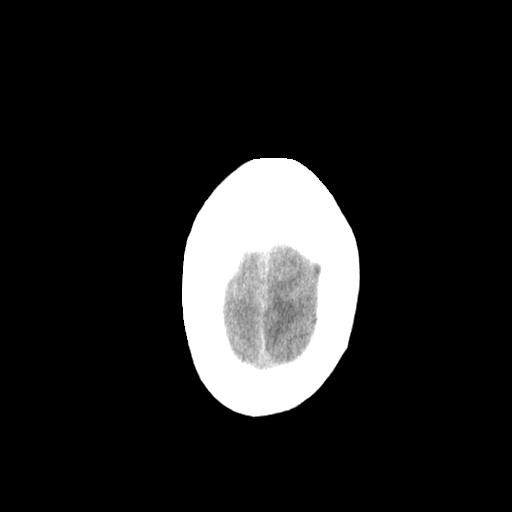

[Series 3: head bone · axial · 0.47mm/px · z∈[-315,-283]mm · 3 of 84 slices shown]
[im 9/84  bone]
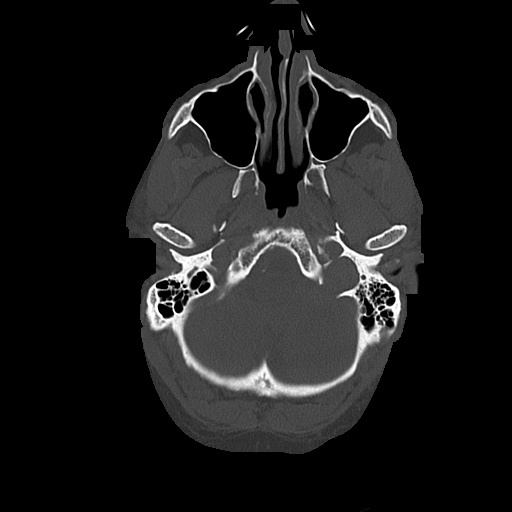
[im 17/84  bone]
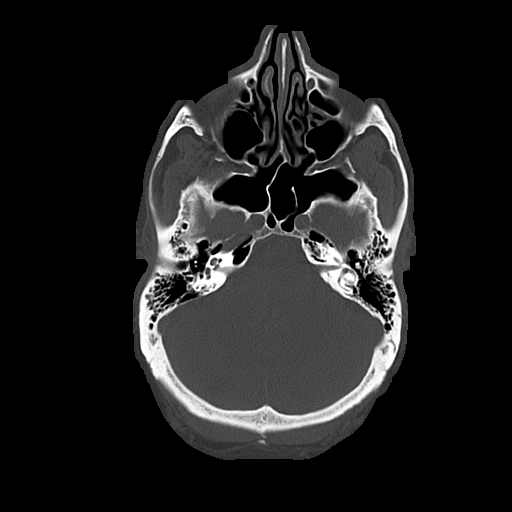
[im 25/84  bone]
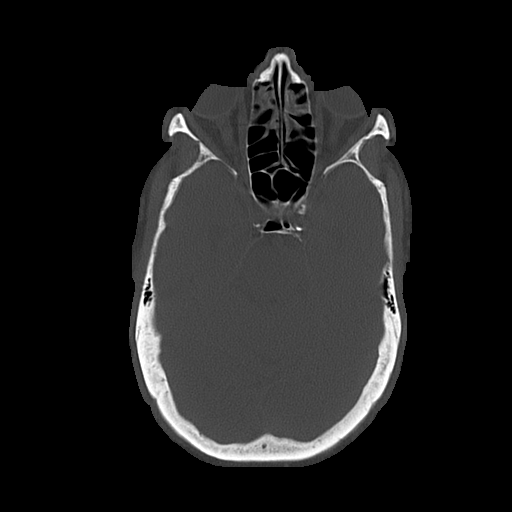

[Series 4: coronal soft · coronal · 0.33mm/px · 3 of 76 slices shown]
[im 26/76  brain]
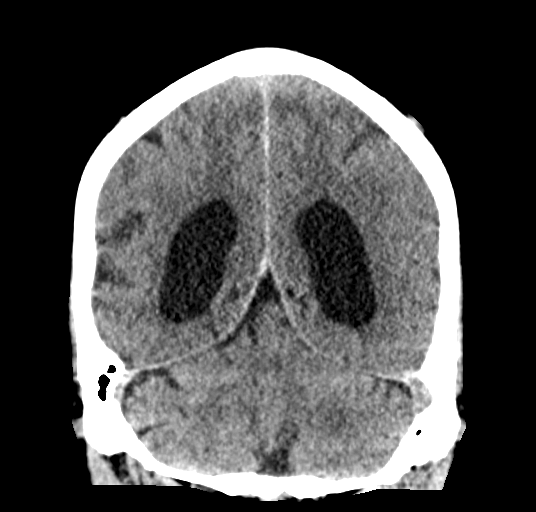
[im 34/76  brain]
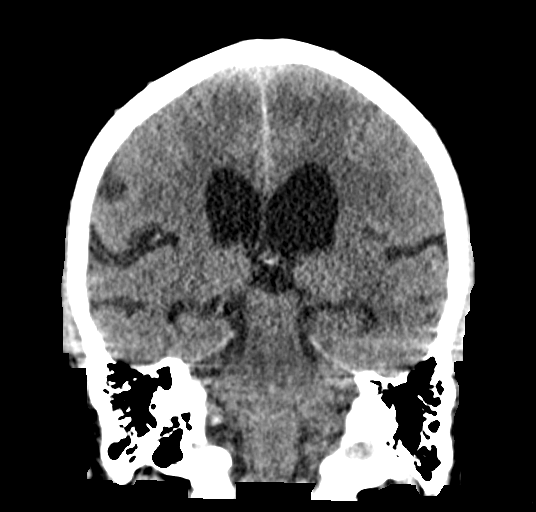
[im 42/76  brain]
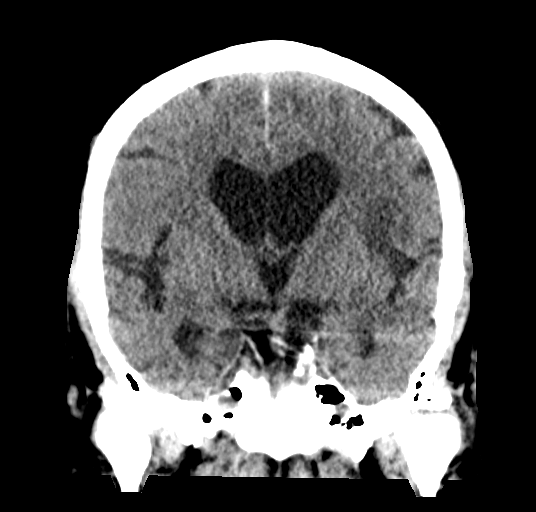

[Series 5: sagittal soft · sagittal · 0.33mm/px · 3 of 60 slices shown]
[im 20/60  brain]
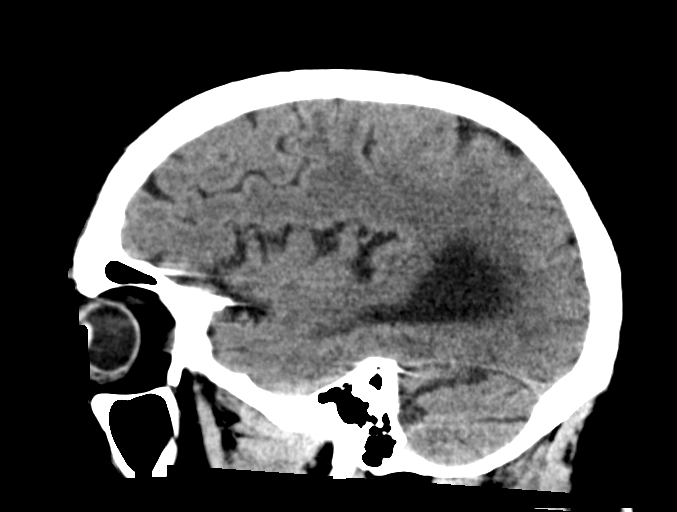
[im 30/60  brain]
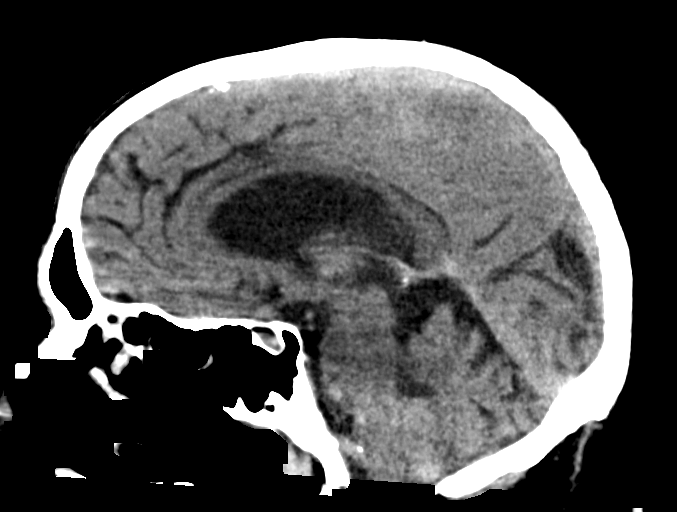
[im 40/60  brain]
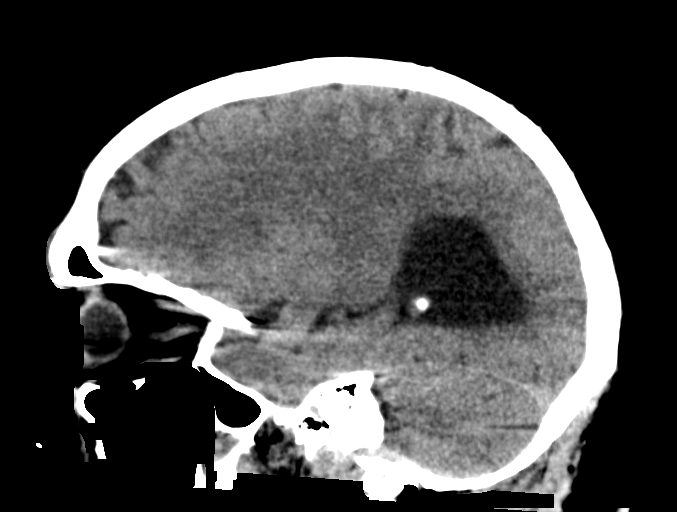

[16 of 47 positions shown; findings below may reference images not displayed]

FINDINGS: Brain: There is a 3.5 cm acute or early subacute left MCA infarct
involving the frontal operculum and insula, possibly with trace
petechial hemorrhage. No malignant hemorrhagic transformation is
present. Mild lateral and third ventriculomegaly has slightly
progressed and is favored to reflect central predominant cerebral
atrophy rather than hydrocephalus. Hypodensities in the cerebral
white matter bilaterally are nonspecific but compatible with mild
chronic small vessel ischemic disease. A tiny chronic right
cerebellar infarct is unchanged. There is no midline shift or
extra-axial fluid collection.

Vascular: Calcified atherosclerosis at the skull base. No definite
acutely hyperdense vessel.

Skull: No fracture or suspicious osseous lesion.

Sinuses/Orbits: Moderate anterior ethmoid air cell mucosal
thickening bilaterally. Clear mastoid air cells. Unremarkable
orbits.

Other: None.
IMPRESSION: 1. 3.5 cm acute/early subacute left MCA infarct.
2. Mild chronic small vessel ischemic disease and cerebral atrophy.

## 2021-03-10 MED ORDER — ONDANSETRON HCL 4 MG/2ML IJ SOLN: 4.0000 mg | Freq: Four times a day (QID) | INTRAMUSCULAR | Status: DC | PRN

## 2021-03-10 MED ORDER — ASPIRIN EC 325 MG PO TBEC
325.0000 mg | DELAYED_RELEASE_TABLET | Freq: Every day | ORAL | Status: DC
  Administered 2021-03-11: 325 mg via ORAL
  Filled 2021-03-10: qty 1

## 2021-03-10 MED ORDER — SODIUM CHLORIDE 0.9 % IV SOLN
100.0000 mL/h | INTRAVENOUS | Status: DC
  Administered 2021-03-10 – 2021-03-11 (×2): 100 mL/h via INTRAVENOUS

## 2021-03-10 MED ORDER — ACETAMINOPHEN 160 MG/5ML PO SOLN: 650.0000 mg | ORAL | Status: DC | PRN

## 2021-03-10 MED ORDER — SODIUM CHLORIDE 0.9 % IV BOLUS
500.0000 mL | Freq: Once | INTRAVENOUS | Status: AC
  Administered 2021-03-10: 500 mL via INTRAVENOUS

## 2021-03-10 MED ORDER — ACETAMINOPHEN 325 MG PO TABS: 650.0000 mg | ORAL_TABLET | ORAL | Status: DC | PRN

## 2021-03-10 MED ORDER — ASPIRIN 81 MG PO CHEW
324.0000 mg | CHEWABLE_TABLET | Freq: Once | ORAL | Status: AC
  Administered 2021-03-10: 324 mg via ORAL
  Filled 2021-03-10: qty 4

## 2021-03-10 MED ORDER — ACETAMINOPHEN 650 MG RE SUPP: 650.0000 mg | RECTAL | Status: DC | PRN

## 2021-03-10 MED ORDER — MELATONIN 5 MG PO TABS: 10.0000 mg | ORAL_TABLET | Freq: Every evening | ORAL | Status: DC | PRN

## 2021-03-10 MED ORDER — ASPIRIN EC 81 MG PO TBEC: 81.0000 mg | DELAYED_RELEASE_TABLET | Freq: Every day | ORAL | Status: DC

## 2021-03-10 MED ORDER — METOPROLOL SUCCINATE ER 25 MG PO TB24
25.0000 mg | ORAL_TABLET | Freq: Every day | ORAL | Status: DC
  Administered 2021-03-10: 25 mg via ORAL
  Filled 2021-03-10: qty 1

## 2021-03-10 MED ORDER — POLYETHYLENE GLYCOL 3350 17 G PO PACK: 17.0000 g | PACK | Freq: Every day | ORAL | Status: DC | PRN

## 2021-03-10 MED ORDER — STROKE: EARLY STAGES OF RECOVERY BOOK
Freq: Once | Status: AC
  Filled 2021-03-10: qty 1

## 2021-03-10 MED ORDER — HYDRALAZINE HCL 20 MG/ML IJ SOLN: 10.0000 mg | Freq: Four times a day (QID) | INTRAMUSCULAR | Status: DC | PRN

## 2021-03-10 NOTE — H&P (Signed)
History and Physical    Cody Whitney TML:[id number] DOB: [birth year] DOA: 03/10/2021  PCP: Catha Gosselin, MD  Patient coming from: [facility]    Chief Complaint:  Chief Complaint  Patient presents with  . Aphasia     HPI:    70 year old male with past medical history of chronic atrial fibrillation, hyperlipidemia, hypertension and nicotine dependence who presents to [facility] with complaints of episodic aphasia.  Patient explains that the evening of 5/9 he was at a church meeting.  He was to leave the group and prior when he stood up and found out that he could not speak.  This inability to speak persisted throughout the meeting.  Patient denies any associated headaches, visual changes, loss of balance or focal weakness along with the symptom.  The patient proceeded to drive home instead of seeking medical attention that evening.  Patient lives alone and therefore did not have the opportunity to speak to anyone else.    The following morning the patient reports that he was completely asymptomatic.  He was contacted by a friend from church via phone conversation inquiring with concern about why he could not speak yesterday evening.  His friend then got the church pastor involved who apparently was a physician in [place] but never completed a [place] residency.  His pastor insisted that he seek medical attention and the patient then decided to present to [facility] emergency department for evaluation.  Upon evaluation in the emergency department CT imaging of the head revealed area of acute or early subacute left MCA infarction.  Due to the identification of an acute stroke hospitalist group was called and the patient was transferred to [facility] for continued medical care and neurology consultation.  Review of Systems:   Review of Systems  Neurological: Positive for speech change.  All other systems reviewed and are negative.   Past Medical History:  Diagnosis Date  . Atrial  fibrillation (HCC) 11/10/2011  . Headache     Past Surgical History:  Procedure Laterality Date  . FACIAL FRACTURE SURGERY    . TONSILLECTOMY       reports that he has been smoking cigarettes. He has been smoking about 0.50 packs per day. He has never used smokeless tobacco. He reports that he does not drink alcohol and does not use drugs.  Allergies  Allergen Reactions  . Hydrocodone Nausea And Vomiting    Family History  Adopted: Yes  Problem Relation Age of Onset  . Atrial fibrillation Sister   . Migraines Sister      Prior to Admission medications   Medication Sig Start Date End Date Taking? Authorizing Provider  aspirin EC 81 MG EC tablet Take 1 tablet (81 mg total) by mouth daily. 03/22/18   Nahser, Deloris Ping, MD  metoprolol succinate (TOPROL-XL) 25 MG 24 hr tablet Take 1 tablet (25 mg total) by mouth daily. 03/27/20   Nahser, Deloris Ping, MD  Multiple Vitamins-Minerals (CENTRUM SILVER 50+MEN PO) 1 tablet    [provider]  Multiple Vitamins-Minerals (CENTRUM SILVER PO) Take 1 tablet by mouth daily.     [provider]    Physical Exam: Vitals:   03/10/21 1645 03/10/21 1700 03/10/21 1800 03/10/21 1939  BP: (!) 146/85 (!) 156/82 (!) 146/96 130/89  Pulse: 82 90 79 73  Resp: (!) 25 (!) 22 20 17   Temp:    98.6 F (37 C)  TempSrc:    Oral  SpO2: 98% 98% 99% 100%  Weight:  Height:        Constitutional: Awake alert and oriented x3, no associated distress.   Skin: no rashes, no lesions, good skin turgor noted. Eyes: Pupils are equally reactive to light.  No evidence of scleral icterus or conjunctival pallor.  ENMT: Moist mucous membranes noted.  Posterior pharynx clear of any exudate or lesions.   Neck: normal, supple, no masses, no thyromegaly.  No evidence of jugular venous distension.   Respiratory: clear to auscultation bilaterally, no wheezing, no crackles. Normal respiratory effort. No accessory muscle use.  Cardiovascular: Regular rate and  rhythm, no murmurs / rubs / gallops. No extremity edema. 2+ pedal pulses. No carotid bruits.  Chest:   Nontender without crepitus or deformity.   Back:   Nontender without crepitus or deformity. Abdomen: Abdomen is soft and nontender.  No evidence of intra-abdominal masses.  Positive bowel sounds noted in all quadrants.   Musculoskeletal: No joint deformity upper and lower extremities. Good ROM, no contractures. Normal muscle tone.  Neurologic: Some mild left facial droop noted although patient states that this is chronic.  Question mild left upper extremity weakness in the proximal distal muscle groups.  Otherwise, normal strength in all other extremities.  CN 2-12 grossly intact. Sensation intact.    Patient is following all commands.  Patient is responsive to verbal stimuli.   Psychiatric: Patient exhibits normal mood with appropriate affect.  Patient seems to possess insight as to their current situation.     Labs on Admission: I have personally reviewed following labs and imaging studies -   CBC: Recent Labs  Lab 03/10/21 1538  WBC 10.6*  NEUTROABS 5.9  HGB 14.8  HCT 43.2  MCV 94.7  PLT 306   Basic Metabolic Panel: Recent Labs  Lab 03/10/21 1538  NA 139  K 3.7  CL 104  CO2 25  GLUCOSE 130*  BUN 15  CREATININE 1.09  CALCIUM 8.8*   GFR: Estimated Creatinine Clearance: 77.2 mL/min (by C-G formula based on SCr of 1.09 mg/dL). Liver Function Tests: Recent Labs  Lab 03/10/21 1538  AST 17  ALT 19  ALKPHOS 52  BILITOT 0.4  PROT 6.8  ALBUMIN 4.0   No results for input(s): LIPASE, AMYLASE in the last 168 hours. No results for input(s): AMMONIA in the last 168 hours. Coagulation Profile: Recent Labs  Lab 03/10/21 1538  INR 1.1   Cardiac Enzymes: No results for input(s): CKTOTAL, CKMB, CKMBINDEX, TROPONINI in the last 168 hours. BNP (last 3 results) No results for input(s): PROBNP in the last 8760 hours. HbA1C: No results for input(s): HGBA1C in the last 72  hours. CBG: No results for input(s): GLUCAP in the last 168 hours. Lipid Profile: No results for input(s): CHOL, HDL, LDLCALC, TRIG, CHOLHDL, LDLDIRECT in the last 72 hours. Thyroid Function Tests: No results for input(s): TSH, T4TOTAL, FREET4, T3FREE, THYROIDAB in the last 72 hours. Anemia Panel: No results for input(s): VITAMINB12, FOLATE, FERRITIN, TIBC, IRON, RETICCTPCT in the last 72 hours. Urine analysis:    Component Value Date/Time   COLORURINE YELLOW 03/10/2021 1800   APPEARANCEUR CLEAR 03/10/2021 1800   LABSPEC 1.019 03/10/2021 1800   PHURINE 6.0 03/10/2021 1800   GLUCOSEU NEGATIVE 03/10/2021 1800   HGBUR SMALL (A) 03/10/2021 1800   BILIRUBINUR NEGATIVE 03/10/2021 1800   KETONESUR NEGATIVE 03/10/2021 1800   PROTEINUR NEGATIVE 03/10/2021 1800   NITRITE NEGATIVE 03/10/2021 1800   LEUKOCYTESUR NEGATIVE 03/10/2021 1800    Radiological Exams on Admission - Personally Reviewed: CT HEAD WO  CONTRAST  Result Date: 03/10/2021 CLINICAL DATA:  Speech disturbance. EXAM: CT HEAD WITHOUT CONTRAST TECHNIQUE: Contiguous axial images were obtained from the base of the skull through the vertex without intravenous contrast. COMPARISON:  06/14/2017 FINDINGS: Brain: There is a 3.5 cm acute or early subacute left MCA infarct involving the frontal operculum and insula, possibly with trace petechial hemorrhage. No malignant hemorrhagic transformation is present. Mild lateral and third ventriculomegaly has slightly progressed and is favored to reflect central predominant cerebral atrophy rather than hydrocephalus. Hypodensities in the cerebral white matter bilaterally are nonspecific but compatible with mild chronic small vessel ischemic disease. A tiny chronic right cerebellar infarct is unchanged. There is no midline shift or extra-axial fluid collection. Vascular: Calcified atherosclerosis at the skull base. No definite acutely hyperdense vessel. Skull: No fracture or suspicious osseous lesion.  Sinuses/Orbits: Moderate anterior ethmoid air cell mucosal thickening bilaterally. Clear mastoid air cells. Unremarkable orbits. Other: None. IMPRESSION: 1. 3.5 cm acute/early subacute left MCA infarct. 2. Mild chronic small vessel ischemic disease and cerebral atrophy. Electronically Signed   By: Sebastian Ache M.D.   On: 03/10/2021 16:31    EKG: Personally reviewed.  Rhythm is atrial fibrillation with heart rate of 85 bpm.  No dynamic ST segment changes appreciated.  Assessment/Plan Principal Problem:   Acute ischemic left MCA stroke (HCC)   Identification of 3.5 cm area of acute or subacute infarction in the left MCA region.  Locations of stroke very well explains the patient's period of aphasia that he experienced yesterday evening.  Case discussed with Dr. Amada Jupiter with neurology.  He recommends increasing patient's home regimen of aspirin to 325 mg daily.  Culprit of the stroke is likely patient's known history of atrial fibrillation and patient will eventually need to be on full dose anticoagulation in the near future.  Patient refusing statin therapy despite repeated attempts to convince him that it is an evidence proven treatment for acute stroke.  Patient states that he "read too many things on the Internet about it."  Performing serial neurologic checks  Permissive hypertension for now  Obtaining noncontrast MRI brain  Obtaining CT angiogram of the head and neck  Obtaining echocardiogram in the morning  Monitoring patient on telemetry  PT, OT, SLP evaluation in the morning.  Active Problems:   Atrial fibrillation, chronic (HCC)   Continuing home regimen of low-dose metoprolol for now  Monitoring patient on telemetry  Patient historically has not been on full dose anticoagulation and has simply only been on aspirin 81 mg daily.  Patient will now likely need to be on full dose anticoagulation in the near future due to the suspicion of cardioembolic stroke.     Essential hypertension   Low-dose metoprolol for rate control  Otherwise, permissive hypertension  Intravenous antihypertensives for markedly elevated blood pressure    Mixed hyperlipidemia   Patient has a longstanding history of hyperlipidemia but has never been on treatment due to a longstanding distrust for statin therapy  Have attempted to convince the patient that a statin is required for adequate treatment of his stroke however patient continues to refuse.  I have communicated this refusal with neurology.    Nicotine dependence, cigarettes, uncomplicated   Counseling the patient on cessation daily.   Code Status:  Full code Family Communication: deferred   Status is: Inpatient  Remains inpatient appropriate because:Ongoing diagnostic testing needed not appropriate for outpatient work up and Inpatient level of care appropriate due to severity of illness   Dispo: The patient  is from: Home              Anticipated d/c is to: Home              Patient currently is not medically stable to d/c.   Difficult to place patient No        Marinda ElkGeorge J Leandro Berkowitz MD [facility] Pager [phone]- [phone]  If 7PM-7AM, please contact night-coverage [email or web address] Use universal [place] password for that web site. If you do not have the password, please call the hospital operator.  03/10/2021, 11:00 PM

## 2021-03-10 NOTE — Consult Note (Signed)
Neurology Consultation Reason for Consult: Stroke Referring Physician: Leafy Half, G  CC: word finding difficulty  History is obtained from: Patient  HPI: Cody Whitney is a 70 y.o. male with a history of atrial fibrillation not on anticoagulation who presents with difficulty speaking.  He started having difficulty finding his words last night, unclear on the exact time, but around 6:30 PM.  He denies any headache, visual change, weakness or numbness.   LKW: 5/9, early evening tpa given?: no, outside of window     ROS: A 14 point ROS was performed and is negative except as noted in the HPI.   Past Medical History:  Diagnosis Date  . Atrial fibrillation (HCC) 11/10/2011  . Headache      Family History  Adopted: Yes  Problem Relation Age of Onset  . Atrial fibrillation Sister   . Migraines Sister      Social History:  reports that he has been smoking cigarettes. He has been smoking about 0.50 packs per day. He has never used smokeless tobacco. He reports that he does not drink alcohol and does not use drugs.   Exam: Current vital signs: BP 130/89 (BP Location: Right Arm)   Pulse 73   Temp 98.6 F (37 C) (Oral)   Resp 17   Ht 6' (1.829 m)   Wt 99.8 kg   SpO2 100%   BMI 29.84 kg/m  Vital signs in last 24 hours: Temp:  [98.6 F (37 C)] 98.6 F (37 C) (05/10 1939) Pulse Rate:  [73-90] 73 (05/10 1939) Resp:  [17-25] 17 (05/10 1939) BP: (130-174)/(74-96) 130/89 (05/10 1939) SpO2:  [98 %-100 %] 100 % (05/10 1939) Weight:  [99.8 kg] 99.8 kg (05/10 1508)   Physical Exam  Constitutional: Appears well-developed and well-nourished.  Psych: Affect appropriate to situation Eyes: No scleral injection HENT: No OP obstruction MSK: no joint deformities.  Cardiovascular: Normal rate and regular rhythm.  Respiratory: Effort normal, non-labored breathing GI: Soft.  No distension. There is no tenderness.  Skin: WDI  Neuro: Mental Status: Patient is awake, alert,  oriented to person, place, month, year, and situation. Patient is able to give a clear and coherent history. No signs of aphasia or neglect Cranial Nerves: II: Visual Fields are full. Pupils are equal, round, and reactive to light.   III,IV, VI: EOMI without ptosis or diploplia.  V: Facial sensation is symmetric to temperature VII: Facial movement with possible mild left facial weakness VIII: hearing is intact to voice X: Uvula elevates symmetrically XI: Shoulder shrug is symmetric. XII: tongue is midline without atrophy or fasciculations.  Motor: Tone is normal. Bulk is normal. 5/5 strength was present on the right side, he has mild impairment of his fine motor movements on the left and orbits the right around the left.  No drift in left lower extremity, but 4+/5 to confrontation Sensory: Sensation is symmetric to light touch and temperature in the arms and legs.  He does not extinguish Cerebellar: FNF slightly impaired on the left   I have reviewed labs in epic and the results pertinent to this consultation are: Creatinine 1.09  I have reviewed the images obtained: CT head hypodensity in the anterior MCA distribution on the left  Impression: 70 year old male with likely embolic infarct secondary to atrial fibrillation without anticoagulation..  Likely his deficits are not as significant as what I would expect given the size of the infarct.  I would favor aspirin for now, but he will need to be on anticoagulation  for the long-term.  He will need further work-up as below.  Recommendations: - HgbA1c, fasting lipid panel - MRI of the brain without contrast - Frequent neuro checks - Echocardiogram - Carotid dopplers - Prophylactic therapy-Antiplatelet med: Aspirin - dose 325mg  PO or 300mg  PR - Risk factor modification - Telemetry monitoring - PT consult, OT consult, Speech consult - Stroke team to follow    , MD [facility] [phone]  If  7pm- 7am, please page neurology on call as listed in AMION.

## 2021-03-10 NOTE — Care Management (Signed)
70 yo male with PMHx of A-fib, HLD, HTN, migraines coming from [facility] with word finding difficulties since yesterday evening (5/9).  Found to have an acute Left MCA infarct.  Transferring to [facility] for admission and stroke evaluation.   Hx of A-fib, only on ASA, not fully anti-coagulated.  Vitals and labs unremarkable.   Given ASA in the ED.   Consult neurology / stroke team on arrival.

## 2021-03-10 NOTE — Plan of Care (Signed)

## 2021-03-10 NOTE — ED Triage Notes (Signed)
Patient has been "having trouble getting my words out, I know what I'm trying to say but I'm having trouble saying it".  Symptoms first noticed ~1830 last night per patient, unsure of when his last known normal was.  Denies weakness, visual changes, slurred speech.  Denies pain or any other symptoms, hx of chronic a-fib

## 2021-03-10 NOTE — ED Provider Notes (Signed)
[facility] EMERGENCY DEPT Provider Note   CSN: [id number] Arrival date & time: 03/10/21  1452     History Chief Complaint  Patient presents with  . Aphasia    Cody Whitney is a 70 y.o. male.  HPI   Patient presented to ED for evaluation of difficulty with his speech.  Patient states he noticed the symptoms sometime yesterday evening.  They have persisted throughout today.  He finds that he is having difficulty finding certain words.  He is not having any other symptoms of headache.  No fevers or chills.  He denies any focal numbness or weakness.  No trouble with his gait.  Patient does have history of atrial fibrillation that has been managed with metoprolol and aspirin.  He is not on any other anticoagulants  Past Medical History:  Diagnosis Date  . Atrial fibrillation (HCC) 11/10/2011  . Headache     Patient Active Problem List   Diagnosis Date Noted  . Hyperlipidemia 03/27/2020  . Chronic migraine 07/28/2017  . HTN (hypertension) 04/01/2014  . Atrial fibrillation (HCC) 11/10/2011    Past Surgical History:  Procedure Laterality Date  . FACIAL FRACTURE SURGERY    . TONSILLECTOMY         Family History  Adopted: Yes  Problem Relation Age of Onset  . Atrial fibrillation Sister   . Migraines Sister     Social History   Tobacco Use  . Smoking status: Current Every Day Smoker    Packs/day: 0.50    Types: Cigarettes  . Smokeless tobacco: Never Used  Vaping Use  . Vaping Use: Never used  Substance Use Topics  . Alcohol use: No    Comment: Last used 1980  . Drug use: No    Home Medications Prior to Admission medications   Medication Sig Start Date End Date Taking? Authorizing Provider  aspirin EC 81 MG EC tablet Take 1 tablet (81 mg total) by mouth daily. 03/22/18   Nahser, Deloris Ping, MD  metoprolol succinate (TOPROL-XL) 25 MG 24 hr tablet Take 1 tablet (25 mg total) by mouth daily. 03/27/20   Nahser, Deloris Ping, MD  Multiple  Vitamins-Minerals (CENTRUM SILVER 50+MEN PO) 1 tablet    [provider]  Multiple Vitamins-Minerals (CENTRUM SILVER PO) Take 1 tablet by mouth daily.     [provider]    Allergies    Hydrocodone  Review of Systems   Review of Systems  All other systems reviewed and are negative.   Physical Exam Updated Vital Signs BP (!) 146/85   Pulse 82   Temp 98.6 F (37 C) (Oral)   Resp (!) 25   Ht 1.829 m (6')   Wt 99.8 kg   SpO2 98%   BMI 29.84 kg/m   Physical Exam Vitals and nursing note reviewed.  Constitutional:      General: He is not in acute distress.    Appearance: He is well-developed.  HENT:     Head: Normocephalic and atraumatic.     Right Ear: External ear normal.     Left Ear: External ear normal.  Eyes:     General: No scleral icterus.       Right eye: No discharge.        Left eye: No discharge.     Conjunctiva/sclera: Conjunctivae normal.  Neck:     Trachea: No tracheal deviation.  Cardiovascular:     Rate and Rhythm: Normal rate and regular rhythm.  Pulmonary:  Effort: Pulmonary effort is normal. No respiratory distress.     Breath sounds: Normal breath sounds. No stridor. No wheezing or rales.  Abdominal:     General: Bowel sounds are normal. There is no distension.     Palpations: Abdomen is soft.     Tenderness: There is no abdominal tenderness. There is no guarding or rebound.  Musculoskeletal:        General: No tenderness.     Cervical back: Neck supple.  Skin:    General: Skin is warm and dry.     Findings: No rash.  Neurological:     Mental Status: He is alert and oriented to person, place, and time.     Cranial Nerves: No cranial nerve deficit (No facial droop, extraocular movements intact, tongue midline , word finding difficulty ).     Sensory: No sensory deficit.     Motor: No abnormal muscle tone or seizure activity.     Coordination: Coordination normal.     Comments: No pronator drift bilateral upper extrem,  able to hold both legs off bed for 5 seconds, sensation intact in all extremities, no visual field cuts, no left or right sided neglect, normal finger-nose exam bilaterally, no nystagmus noted      ED Results / Procedures / Treatments   Labs (all labs ordered are listed, but only abnormal results are displayed) Labs Reviewed  CBC - Abnormal; Notable for the following components:      Result Value   WBC 10.6 (*)    All other components within normal limits  DIFFERENTIAL - Abnormal; Notable for the following components:   Monocytes Absolute 1.5 (*)    All other components within normal limits  COMPREHENSIVE METABOLIC PANEL - Abnormal; Notable for the following components:   Glucose, Bld 130 (*)    Calcium 8.8 (*)    All other components within normal limits  RESP PANEL BY RT-PCR (FLU A&B, COVID) ARPGX2  PROTIME-INR  APTT  ETHANOL  RAPID URINE DRUG SCREEN, HOSP PERFORMED  URINALYSIS, ROUTINE W REFLEX MICROSCOPIC    EKG EKG Interpretation  Date/Time:  Tuesday Mar 10 2021 15:06:16 EDT Ventricular Rate:  85 PR Interval:    QRS Duration: 80 QT Interval:  343 QTC Calculation: 408 R Axis:   34 Text Interpretation: Atrial fibrillation No significant change since last tracing Confirmed by Linwood Dibbles [phone]) on 03/10/2021 3:16:59 PM   Radiology CT HEAD WO CONTRAST  Result Date: 03/10/2021 CLINICAL DATA:  Speech disturbance. EXAM: CT HEAD WITHOUT CONTRAST TECHNIQUE: Contiguous axial images were obtained from the base of the skull through the vertex without intravenous contrast. COMPARISON:  06/14/2017 FINDINGS: Brain: There is a 3.5 cm acute or early subacute left MCA infarct involving the frontal operculum and insula, possibly with trace petechial hemorrhage. No malignant hemorrhagic transformation is present. Mild lateral and third ventriculomegaly has slightly progressed and is favored to reflect central predominant cerebral atrophy rather than hydrocephalus. Hypodensities in the  cerebral white matter bilaterally are nonspecific but compatible with mild chronic small vessel ischemic disease. A tiny chronic right cerebellar infarct is unchanged. There is no midline shift or extra-axial fluid collection. Vascular: Calcified atherosclerosis at the skull base. No definite acutely hyperdense vessel. Skull: No fracture or suspicious osseous lesion. Sinuses/Orbits: Moderate anterior ethmoid air cell mucosal thickening bilaterally. Clear mastoid air cells. Unremarkable orbits. Other: None. IMPRESSION: 1. 3.5 cm acute/early subacute left MCA infarct. 2. Mild chronic small vessel ischemic disease and cerebral atrophy. Electronically Signed   By:  Sebastian Ache M.D.   On: 03/10/2021 16:31    Procedures Procedures   Medications Ordered in ED Medications  sodium chloride 0.9 % bolus 500 mL (500 mLs Intravenous New Bag/Given 03/10/21 1615)    Followed by  0.9 %  sodium chloride infusion (100 mL/hr Intravenous New Bag/Given 03/10/21 1613)    ED Course  I have reviewed the triage vital signs and the nursing notes.  Pertinent labs & imaging results that were available during my care of the patient were reviewed by me and considered in my medical decision making (see chart for details).  Clinical Course as of 03/10/21 1721  Tue Mar 10, 2021  1625 CBC unremarkable [JK]  1650 CT scan does show a left-sided subacute infarct [JK]  1656 Metabolic panel unremarkable. [JK]  1657 Discussed with Dr Denton Lank.  Will admit to [facility] [JK]    Clinical Course User Index [JK] Linwood Dibbles, MD   MDM Rules/Calculators/A&P               NIH Stroke Scale: 1           Patient presented to the ED for evaluation of aphasia that started yesterday evening.  On exam the patient does have some mild word finding difficulties.  No other focal deficits noted.  Patient's NIH stroke scale of 1.  Head CT does show a subacute infarct.  No indications for tPA at this time.  Patient is well outside treatment window.  He  is LVO screen negative.  Pt does have atrial fibrillation, only on asa.  Stroke may be related to his a fib. I discussed the findings with the patient and his wife.  I will consult with the medical service of [facility] for admission.  Patient will be able to get neurology consultation at that time Final Clinical Impression(s) / ED Diagnoses Final diagnoses:  Cerebrovascular accident (CVA), unspecified mechanism (HCC)      Linwood Dibbles, MD 03/10/21 1658

## 2021-03-11 ENCOUNTER — Inpatient Hospital Stay (HOSPITAL_BASED_OUTPATIENT_CLINIC_OR_DEPARTMENT_OTHER): Payer: Medicare Other

## 2021-03-11 ENCOUNTER — Inpatient Hospital Stay (HOSPITAL_COMMUNITY): Payer: Medicare Other

## 2021-03-11 DIAGNOSIS — Z8249 Family history of ischemic heart disease and other diseases of the circulatory system: Secondary | ICD-10-CM | POA: Diagnosis not present

## 2021-03-11 DIAGNOSIS — I639 Cerebral infarction, unspecified: Secondary | ICD-10-CM | POA: Diagnosis present

## 2021-03-11 DIAGNOSIS — R4701 Aphasia: Secondary | ICD-10-CM | POA: Diagnosis not present

## 2021-03-11 DIAGNOSIS — I6389 Other cerebral infarction: Secondary | ICD-10-CM | POA: Diagnosis not present

## 2021-03-11 DIAGNOSIS — I672 Cerebral atherosclerosis: Secondary | ICD-10-CM | POA: Diagnosis not present

## 2021-03-11 DIAGNOSIS — Z79899 Other long term (current) drug therapy: Secondary | ICD-10-CM | POA: Diagnosis not present

## 2021-03-11 DIAGNOSIS — I6523 Occlusion and stenosis of bilateral carotid arteries: Secondary | ICD-10-CM | POA: Diagnosis not present

## 2021-03-11 DIAGNOSIS — I482 Chronic atrial fibrillation, unspecified: Secondary | ICD-10-CM | POA: Diagnosis not present

## 2021-03-11 DIAGNOSIS — Z20822 Contact with and (suspected) exposure to covid-19: Secondary | ICD-10-CM | POA: Diagnosis not present

## 2021-03-11 DIAGNOSIS — Z7982 Long term (current) use of aspirin: Secondary | ICD-10-CM | POA: Diagnosis not present

## 2021-03-11 DIAGNOSIS — I63412 Cerebral infarction due to embolism of left middle cerebral artery: Secondary | ICD-10-CM | POA: Diagnosis not present

## 2021-03-11 DIAGNOSIS — G43909 Migraine, unspecified, not intractable, without status migrainosus: Secondary | ICD-10-CM | POA: Diagnosis not present

## 2021-03-11 DIAGNOSIS — G919 Hydrocephalus, unspecified: Secondary | ICD-10-CM | POA: Diagnosis not present

## 2021-03-11 DIAGNOSIS — Z885 Allergy status to narcotic agent status: Secondary | ICD-10-CM | POA: Diagnosis not present

## 2021-03-11 DIAGNOSIS — Z8673 Personal history of transient ischemic attack (TIA), and cerebral infarction without residual deficits: Secondary | ICD-10-CM | POA: Diagnosis not present

## 2021-03-11 DIAGNOSIS — R590 Localized enlarged lymph nodes: Secondary | ICD-10-CM | POA: Diagnosis not present

## 2021-03-11 DIAGNOSIS — I63512 Cerebral infarction due to unspecified occlusion or stenosis of left middle cerebral artery: Secondary | ICD-10-CM | POA: Diagnosis not present

## 2021-03-11 DIAGNOSIS — G9389 Other specified disorders of brain: Secondary | ICD-10-CM | POA: Diagnosis not present

## 2021-03-11 DIAGNOSIS — I10 Essential (primary) hypertension: Secondary | ICD-10-CM | POA: Diagnosis not present

## 2021-03-11 DIAGNOSIS — E782 Mixed hyperlipidemia: Secondary | ICD-10-CM | POA: Diagnosis not present

## 2021-03-11 DIAGNOSIS — F17210 Nicotine dependence, cigarettes, uncomplicated: Secondary | ICD-10-CM | POA: Diagnosis not present

## 2021-03-11 LAB — ECHOCARDIOGRAM COMPLETE
AR max vel: 3.63 cm2
AV Area VTI: 4 cm2
AV Area mean vel: 3.7 cm2
AV Mean grad: 3 mmHg
AV Peak grad: 5.3 mmHg
Ao pk vel: 1.15 m/s
Area-P 1/2: 4.89 cm2
Calc EF: 58.9 %
Height: 72 in
MV M vel: 4.08 m/s
MV Peak grad: 66.6 mmHg
S' Lateral: 1.65 cm
Single Plane A2C EF: 55.5 %
Single Plane A4C EF: 61.3 %
Weight: 3520 oz

## 2021-03-11 LAB — LIPID PANEL
Cholesterol: 168 mg/dL (ref 0–200)
HDL: 36 mg/dL — ABNORMAL LOW (ref >40)
LDL Cholesterol: 116 mg/dL — ABNORMAL HIGH (ref 0–99)
Total CHOL/HDL Ratio: 4.7 RATIO
Triglycerides: 78 mg/dL (ref <150)
VLDL: 16 mg/dL (ref 0–40)

## 2021-03-11 LAB — HIV ANTIBODY (ROUTINE TESTING W REFLEX): HIV Screen 4th Generation wRfx: NONREACTIVE

## 2021-03-11 IMAGING — CT CT ANGIO NECK
2 of 7 series · 8 of 33 positions shown · IV contrast (omnipaque)
Comparison: Prior CT from [DATE]

CLINICAL DATA: Follow-up examination for acute stroke.

EXAM:
CT ANGIOGRAPHY HEAD AND NECK
TECHNIQUE: Multidetector CT imaging of the head and neck was performed using
the standard protocol during bolus administration of intravenous
contrast. Multiplanar CT image reconstructions and MIPs were
obtained to evaluate the vascular anatomy. Carotid stenosis
measurements (when applicable) are obtained utilizing NASCET
criteria, using the distal internal carotid diameter as the
denominator.
CONTRAST:  75mL OMNIPAQUE IOHEXOL 350 MG/ML SOLN

[Series 6: cta neck/head · axial · 0.57mm/px · z∈[+1008,+1132]mm · 2 of 187 slices shown]
[im 63/187  soft-tissue]
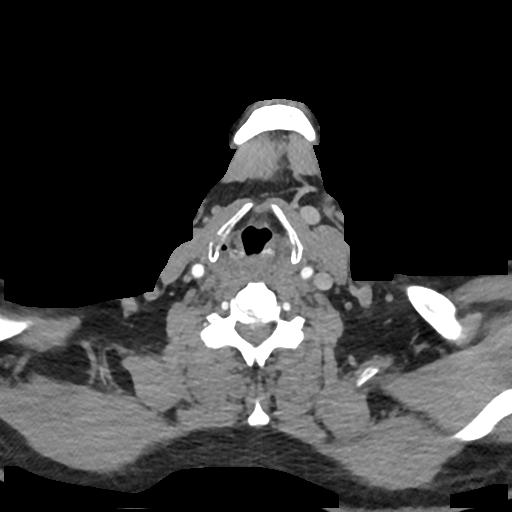
[im 125/187  soft-tissue]
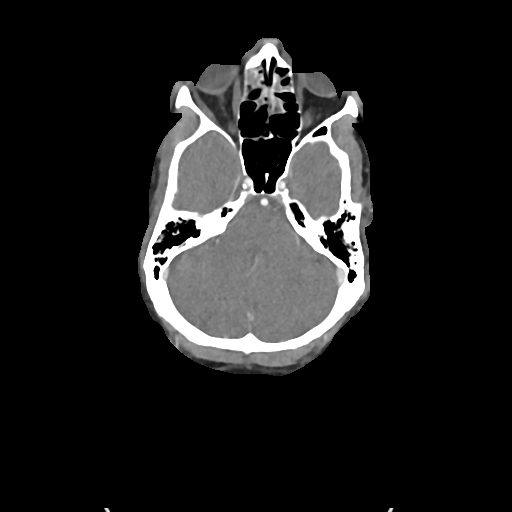

[Series 8: ax thins · axial · 0.47mm/px · z∈[+937,+1203]mm · 6 of 374 slices shown]
[im 54/374  soft-tissue]
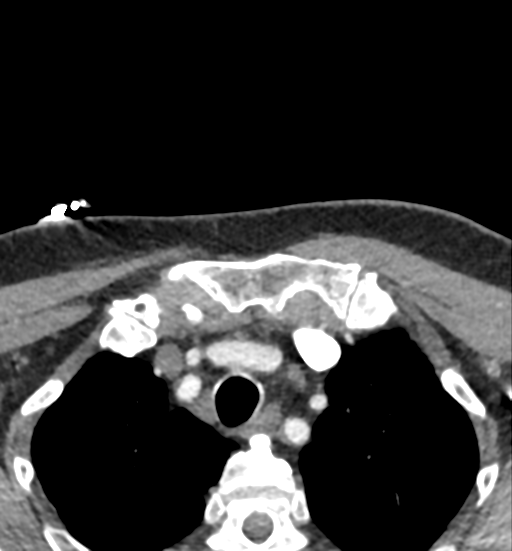
[im 107/374  bone]
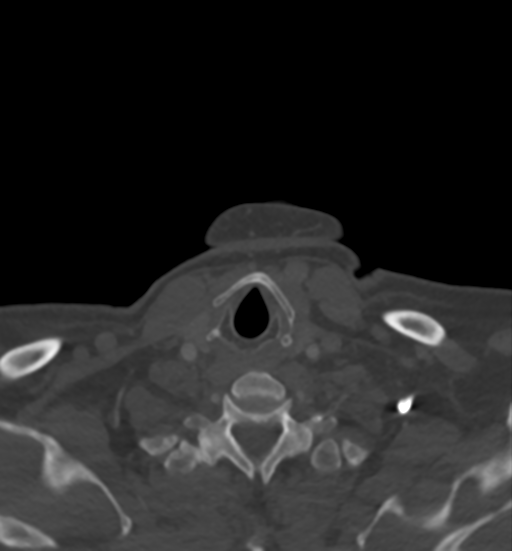
[im 160/374  soft-tissue]
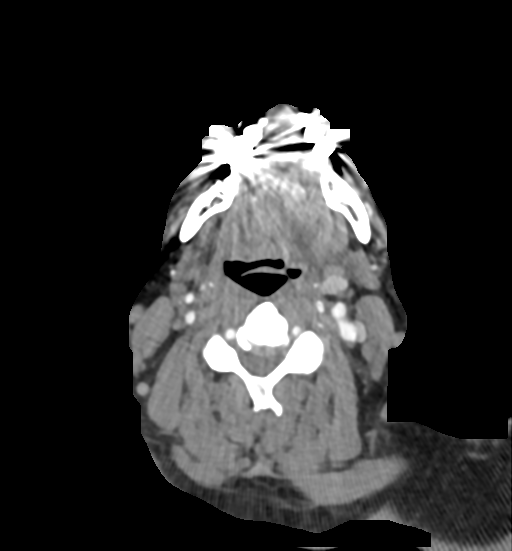
[im 214/374  bone]
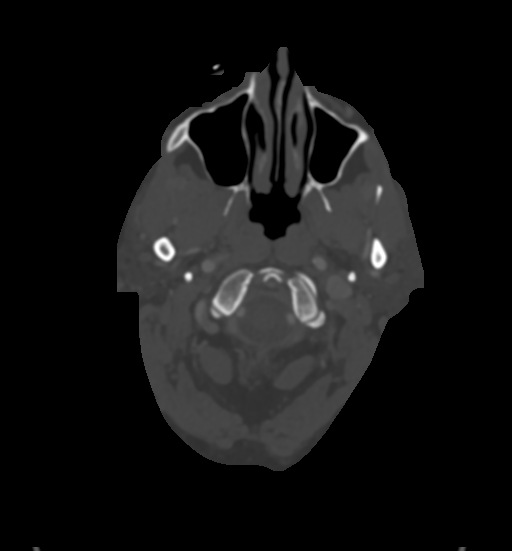
[im 267/374  soft-tissue]
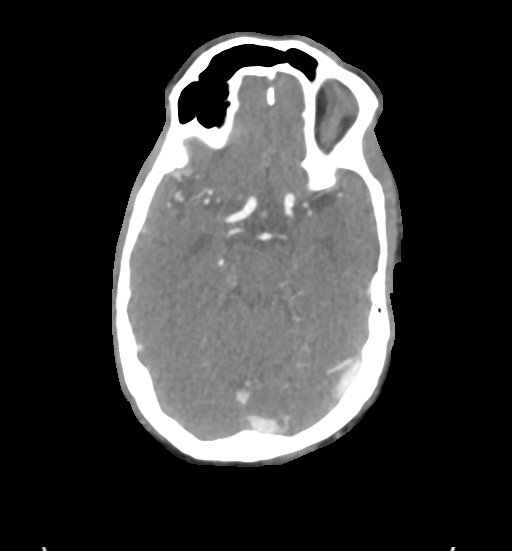
[im 320/374  bone]
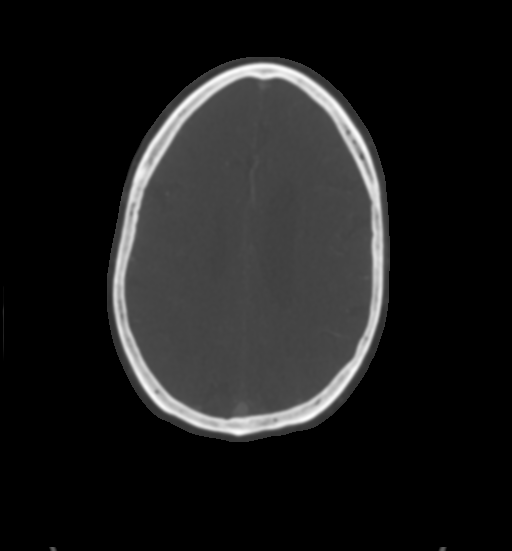

[8 of 33 positions shown; findings below may reference images not displayed]

FINDINGS: CTA NECK FINDINGS

Aortic arch: Visualized aortic arch normal in caliber. Bovine
branching pattern with common origin of the innominate and left
common carotid artery noted. Moderate atheromatous change about the
visualized aorta and origin of the great vessels without high-grade
stenosis.

Right carotid system: Right CCA patent from its origin to the
bifurcation without stenosis. Calcified plaque about the right
carotid bulb without significant stenosis. Right ICA patent distally
without stenosis, dissection or occlusion.

Left carotid system: Left CCA patent from its origin to the
bifurcation without stenosis. Minimal plaque about the left carotid
bulb without stenosis. Left ICA patent distally without stenosis,
dissection or occlusion.

Vertebral arteries: Both vertebral arteries arise from subclavian
arteries. No significant proximal subclavian artery stenosis.
Vertebral arteries patent without stenosis, dissection or occlusion.

Skeleton: No visible acute osseous finding. No discrete or worrisome
osseous lesions.

Other neck: No other acute soft tissue abnormality within the neck.
1.6 cm soft tissue nodule present at the inferior right parotid
gland (series 6, image 96). Smaller 1 cm ovoid soft tissue nodule
present at the inferior left parotid gland (series 6, image 91). No
other mass or adenopathy within the neck.

Upper chest: Mildly enlarged 1.2 cm node present at the AP window.
Additional shotty subcentimeter paratracheal nodes noted as well.
Azygos lobe noted. Subpleural reticular densities within the
anterior aspects of both upper lobes noted, suggesting fibrosis.

Review of the MIP images confirms the above findings

CTA HEAD FINDINGS

Anterior circulation: Petrous segments patent bilaterally. Scattered
atheromatous changes seen throughout the carotid siphons
bilaterally. Associated focal moderate stenosis at the anterior genu
of the cavernous right ICA (series 8, image 114). No more than mild
narrowing about the left carotid siphon. A1 segments patent
bilaterally. Normal anterior communicating artery complex. Anterior
cerebral arteries patent to their distal aspects without stenosis.
No M1 stenosis or occlusion. Normal MCA bifurcations. Distal MCA
branches well perfused and symmetric.

Posterior circulation: Both V4 segments patent to the
vertebrobasilar junction without stenosis. Both PICA origins patent
and normal. Basilar patent to its distal aspect without stenosis.
Superior cerebellar arteries patent bilaterally. Both PCAs primarily
supplied via the basilar well perfused to their distal aspects.

Venous sinuses: Patent allowing for timing the contrast bolus.

Anatomic variants: None significant.  No aneurysm.

Review of the MIP images confirms the above findings
IMPRESSION: 1. Negative CTA for large vessel occlusion.
2. Short-segment moderate stenosis at the anterior genu of the
cavernous right ICA.
3. Additional mild-to-moderate atheromatous change about the aortic
arch, carotid bifurcations, and carotid siphons. No other
hemodynamically significant or correctable stenosis.
4. Bilateral parotid lesions measuring up to 1.6 cm on the right as
above, indeterminate. Outpatient ENT referral for further workup and
consultation suggested.
5. Mildly enlarged 1.2 cm AP window lymph node, indeterminate, but
could be reactive.

## 2021-03-11 IMAGING — MR MR HEAD W/O CM
12 of 13 series · 44 of 48 positions shown · non-contrast
Comparison: Prior CT from [DATE]

CLINICAL DATA: Initial evaluation for acute stroke.

EXAM:
MRI HEAD WITHOUT CONTRAST
TECHNIQUE: Multiplanar, multiecho pulse sequences of the brain and surrounding
structures were obtained without intravenous contrast.

[Series 5: DWI · axial · 3.0mm · 0.88mm/px · z∈[-45,+110]mm · 7 of 108 slices shown (1 of 4)]
[im 1/108]
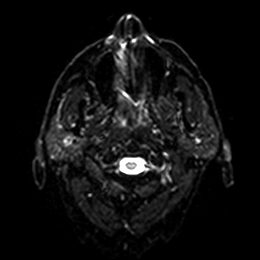
[im 18/108]
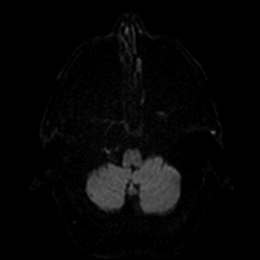
[im 36/108]
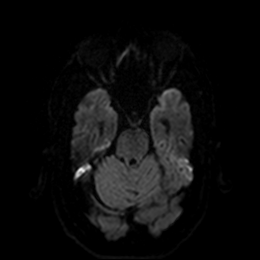
[im 54/108]
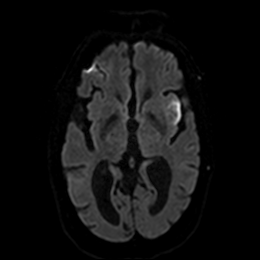
[im 72/108]
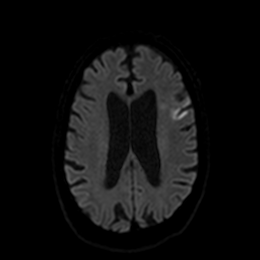
[im 90/108]
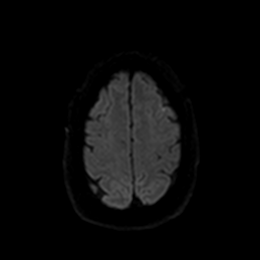
[im 108/108]
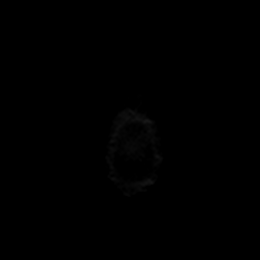

[Series 6: DWI · axial · 3.0mm · 0.88mm/px · z∈[-45,+110]mm · 4 of 54 slices shown (2 of 4)]
[im 1/54]
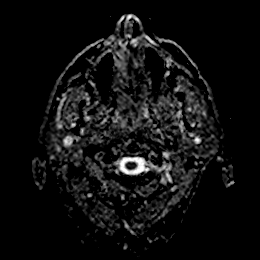
[im 18/54]
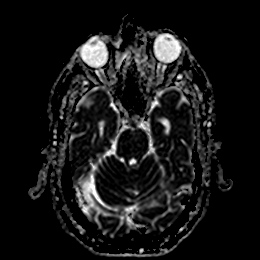
[im 36/54]
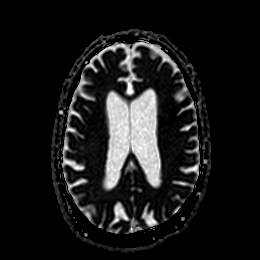
[im 54/54]
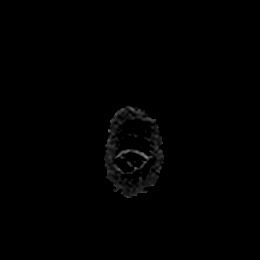

[Series 7: T1 · sagittal · 5.0mm · 0.75mm/px · 2 of 23 slices shown]
[im 1/23]
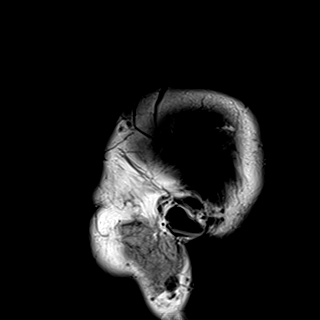
[im 23/23]
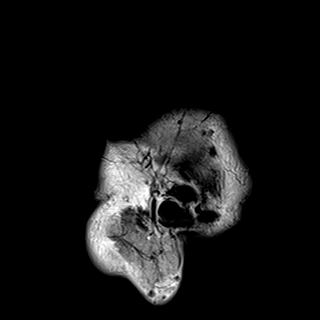

[Series 8: DWI · coronal · 4.0mm · 0.88mm/px · 6 of 78 slices shown (3 of 4)]
[im 1/78]
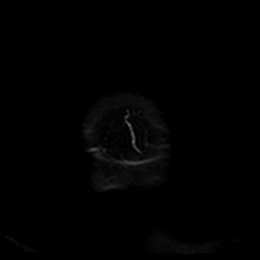
[im 16/78]
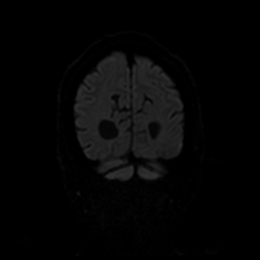
[im 31/78]
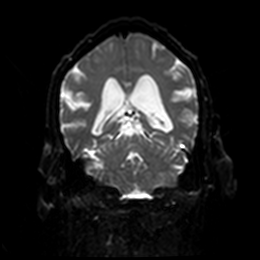
[im 47/78]
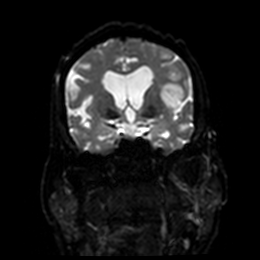
[im 62/78]
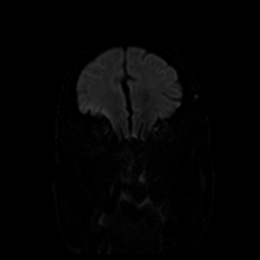
[im 78/78]
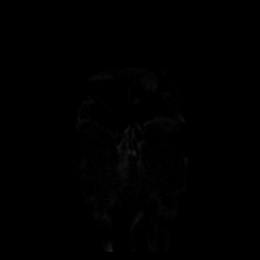

[Series 9: DWI · coronal · 4.0mm · 0.88mm/px · 3 of 39 slices shown (4 of 4)]
[im 1/39]
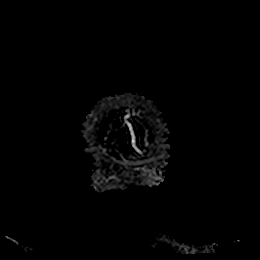
[im 20/39]
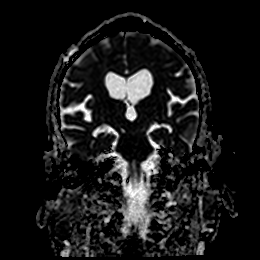
[im 39/39]
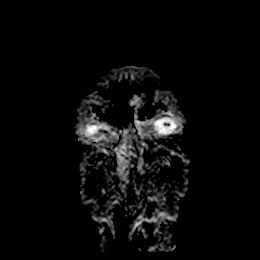

[Series 10: T2 · axial · 5.0mm · 0.72mm/px · z∈[-44,+120]mm · 2 of 29 slices shown (1 of 2)]
[im 1/29]
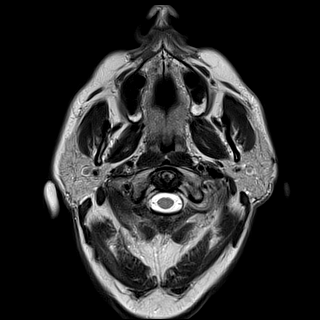
[im 29/29]
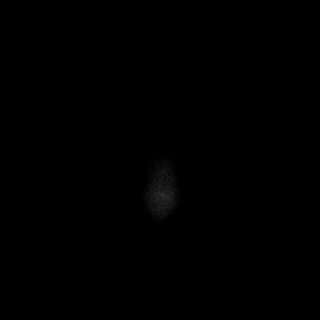

[Series 11: FLAIR · axial · 5.0mm · 0.45mm/px · z∈[-43,+121]mm · 2 of 29 slices shown]
[im 1/29]
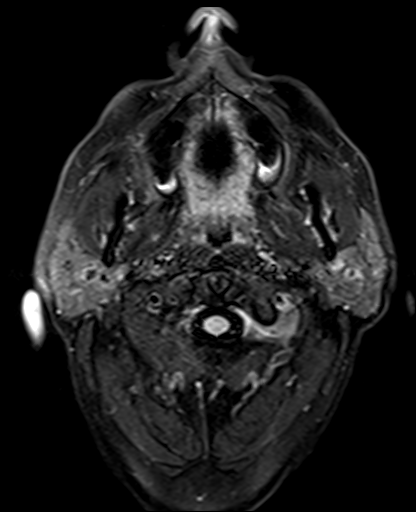
[im 29/29]
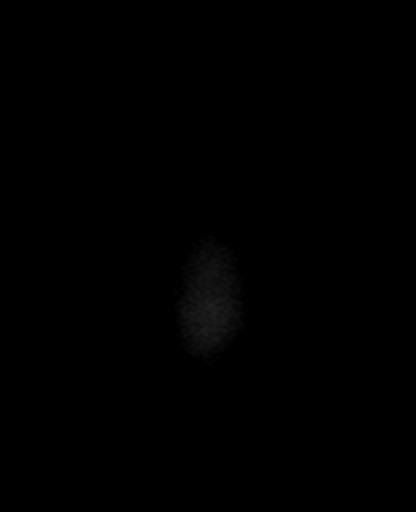

[Series 12: mag_images · axial · 3.0mm · 0.90mm/px · z∈[-41,+120]mm · 4 of 56 slices shown]
[im 1/56]
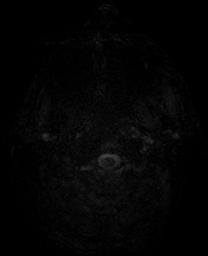
[im 19/56]
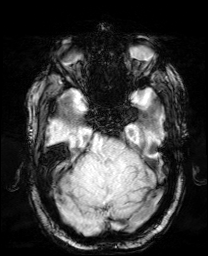
[im 37/56]
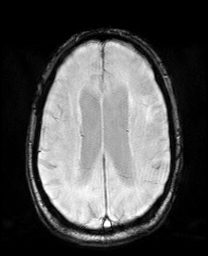
[im 56/56]
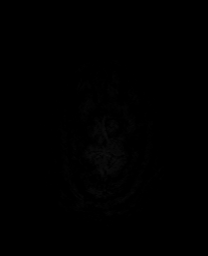

[Series 13: pha_images · axial · 3.0mm · 0.90mm/px · z∈[-38,+120]mm · 4 of 55 slices shown]
[im 1/55]
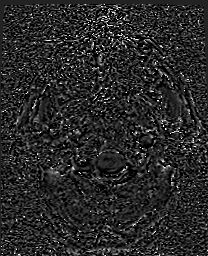
[im 19/55]
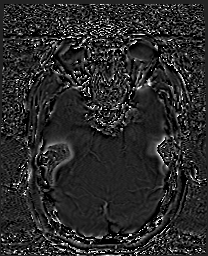
[im 37/55]
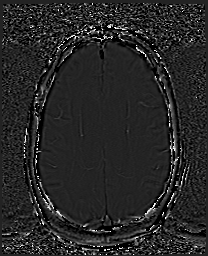
[im 55/55]
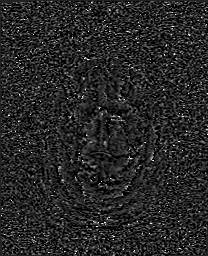

[Series 14: swi_images · axial · 3.0mm · 0.90mm/px · z∈[-41,+120]mm · 4 of 56 slices shown]
[im 1/56]
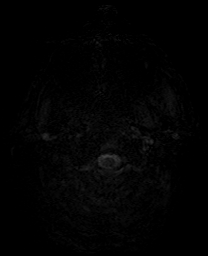
[im 19/56]
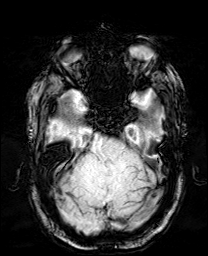
[im 37/56]
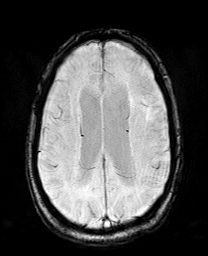
[im 56/56]
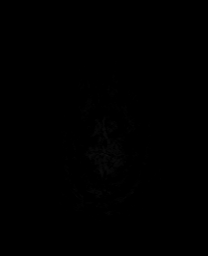

[Series 15: mip_images(sw) · axial · 24.0mm · 0.90mm/px · z∈[-31,+109]mm · 4 of 49 slices shown]
[im 1/49]
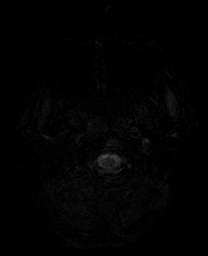
[im 17/49]
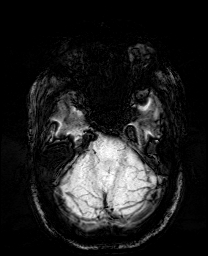
[im 33/49]
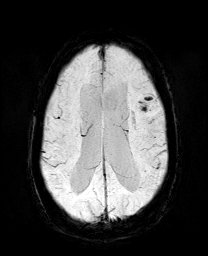
[im 49/49]
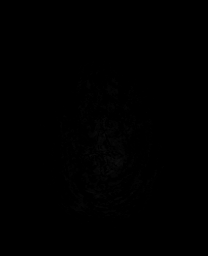

[Series 17: T2 · coronal · 5.0mm · 0.34mm/px · 2 of 32 slices shown (2 of 2)]
[im 1/32]
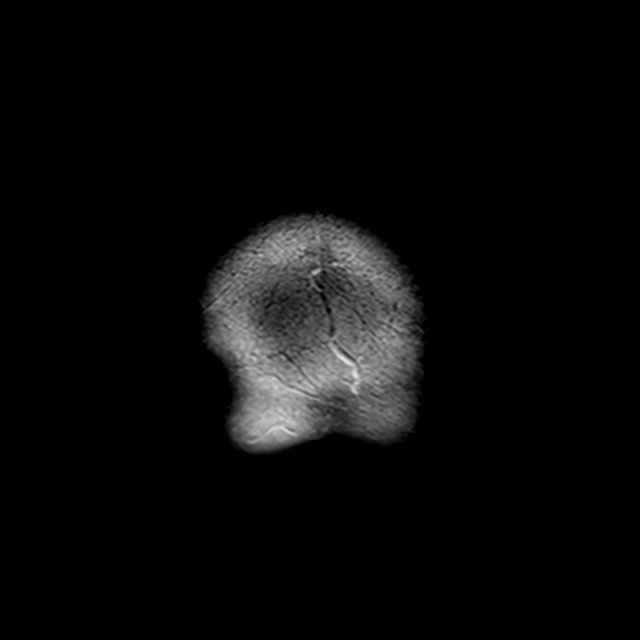
[im 32/32]
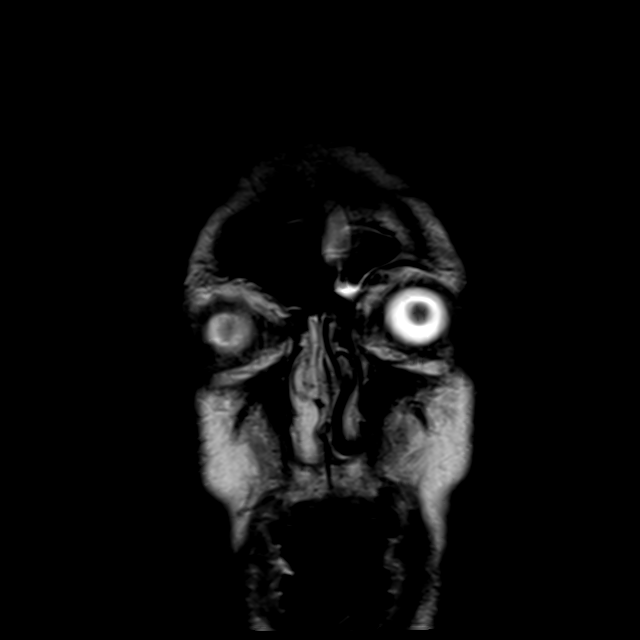

[44 of 48 positions shown; findings below may reference images not displayed]

FINDINGS: Brain: Cerebral volume within normal limits. No significant cerebral
white matter disease for age. Chronic right PCA distribution infarct
with associated hemosiderin staining.

Area of diffusion abnormality involving the left insula and
overlying left frontal operculum, consistent with a subacute left
MCA distribution infarct. Associated petechial hemorrhage without
frank hemorrhagic transformation or significant regional mass
effect. No other evidence for acute or subacute ischemia. Gray-white
matter differentiation otherwise maintained.

No mass lesion, midline shift or mass effect. Mild ventricular
prominence related to global parenchymal volume loss of
hydrocephalus. No extra-axial fluid collection. Pituitary gland
within normal limits. Midline structures intact.

Vascular: Major intracranial vascular flow voids maintained.

Skull and upper cervical spine: Craniocervical junction within
normal limits. Bone marrow signal intensity normal. No scalp soft
tissue abnormality.

Sinuses/Orbits: Globes and orbital soft tissues within normal
limits. Scattered mucosal thickening within the ethmoidal air cells.
No mastoid effusion.

Other: 1.6 cm T2 hypointense lesion present within the inferior
right parotid gland. Additional probable similar lesion measuring
1.1 cm within the contralateral left parotid gland.
IMPRESSION: 1. Subacute left MCA distribution infarct involving the left insula
and overlying left frontal operculum. Associated petechial
hemorrhage without frank hemorrhagic transformation or significant
regional mass effect.
2. Chronic right PCA distribution infarct.
3. T2 hypointense lesions measuring up to 1.6 cm involving the
bilateral parotid glands, indeterminate. Outpatient ENT referral for
further workup and consultation suggested.

## 2021-03-11 MED ORDER — APIXABAN 5 MG PO TABS: 5.0000 mg | ORAL_TABLET | Freq: Two times a day (BID) | ORAL | 1 refills | Status: AC

## 2021-03-11 MED ORDER — IOHEXOL 350 MG/ML SOLN
75.0000 mL | Freq: Once | INTRAVENOUS | Status: AC | PRN
  Administered 2021-03-11: 75 mL via INTRAVENOUS

## 2021-03-11 MED ORDER — APIXABAN 5 MG PO TABS: 5.0000 mg | ORAL_TABLET | Freq: Two times a day (BID) | ORAL | Status: DC

## 2021-03-11 MED ORDER — ATORVASTATIN CALCIUM 20 MG PO TABS: 20.0000 mg | ORAL_TABLET | Freq: Every day | ORAL | 1 refills | Status: AC

## 2021-03-11 NOTE — Evaluation (Signed)
Speech Language Pathology Evaluation Patient Details Name: Cody Whitney MRN: [id number] DOB: [birth year] Today's Date: 03/11/2021 Time: 0240-9735 SLP Time Calculation (min) (ACUTE ONLY): 16 min  Problem List:  Patient Active Problem List   Diagnosis Date Noted  . Acute ischemic left MCA stroke (HCC) 03/10/2021  . Nicotine dependence, cigarettes, uncomplicated 03/10/2021  . Mixed hyperlipidemia 03/27/2020  . Chronic migraine 07/28/2017  . Essential hypertension 04/01/2014  . Atrial fibrillation, chronic (HCC) 11/10/2011   Past Medical History:  Past Medical History:  Diagnosis Date  . Atrial fibrillation (HCC) 11/10/2011  . Headache    Past Surgical History:  Past Surgical History:  Procedure Laterality Date  . FACIAL FRACTURE SURGERY    . TONSILLECTOMY     HPI:  70 y/o male presented to [facility] ED on 5/10 with complaints of word finding difficulty. CT head found acute or early subacute L MCA infarct. Transferred to [facility]. MRI found subacute L MCA infarct and chronic R PCA infarct. PMH: Afib, HLD, HTN, nicotine dependence.   Assessment / Plan / Recommendation Clinical Impression  Patient's cognitive-linguistic function is at baseline and appropriate for return to independent living. In complex conversation SLP noted 1-2 episodes of brief word finding difficulty which patient reports is normal and that all acute aphasia has resolved. Encouraged patient to monitor his language after d/c and reach out to MD for Rx for OP SLP services should this persist.    SLP Assessment  SLP Recommendation/Assessment: Patient does not need any further Speech Lanaguage Pathology Services SLP Visit Diagnosis: Aphasia (R47.01)    Follow Up Recommendations  None          SLP Evaluation Cognition  Overall Cognitive Status: Within Functional Limits for tasks assessed Orientation Level: Oriented X4       Comprehension  Auditory Comprehension Overall Auditory Comprehension: Appears within  functional limits for tasks assessed Visual Recognition/Discrimination Discrimination: Within Function Limits Reading Comprehension Reading Status: Within funtional limits    Expression Expression Primary Mode of Expression: Verbal Verbal Expression Overall Verbal Expression: Appears within functional limits for tasks assessed Written Expression Dominant Hand: Right Written Expression: Not tested   Oral / Motor  Oral Motor/Sensory Function Overall Oral Motor/Sensory Function: Within functional limits Motor Speech Overall Motor Speech: Appears within functional limits for tasks assessed   GO             Ferdinand Lango MA, CCC-SLP         Micha Dosanjh Meryl 03/11/2021, 11:26 AM

## 2021-03-11 NOTE — Evaluation (Signed)
Physical Therapy Evaluation & Discharge Patient Details Name: Cody Whitney MRN: [id number] DOB: 08-Dec-1950 Today's Date: 03/11/2021   History of Present Illness  70 y/o male presented to [facility] ED on 5/10 with complaints of word finding difficulty. CT head found acute or early subacute L MCA infarct. Transferred to [facility]. MRI found subacute L MCA infarct and chronic R PCA infarct. PMH: Afib, HLD, HTN, nicotine dependence.  Clinical Impression  PTA, patient lives alone and reports independence with mobility. Patient currently functioning at independent level for mobility. Patient scored 24/24 on DGI. No further skilled PT needs required acutely. No PT follow up recommended at this time. PT will sign off.     Follow Up Recommendations No PT follow up    Equipment Recommendations  None recommended by PT    Recommendations for Other Services       Precautions / Restrictions Precautions Precautions: None Restrictions Weight Bearing Restrictions: No      Mobility  Bed Mobility Overal bed mobility: Independent                  Transfers Overall transfer level: Independent                  Ambulation/Gait Ambulation/Gait assistance: Independent Gait Distance (Feet): 300 Feet Assistive device: None Gait Pattern/deviations: WFL(Within Functional Limits)   Gait velocity interpretation: >4.37 ft/sec, indicative of normal walking speed    Stairs Stairs: Yes Stairs assistance: Independent Stair Management: No rails;Alternating pattern;Forwards Number of Stairs: 2    Wheelchair Mobility    Modified Rankin (Stroke Patients Only) Modified Rankin (Stroke Patients Only) Pre-Morbid Rankin Score: No symptoms Modified Rankin: No symptoms     Balance Overall balance assessment: No apparent balance deficits (not formally assessed)                               Standardized Balance Assessment Standardized Balance Assessment : Dynamic Gait  Index   Dynamic Gait Index Level Surface: Normal Change in Gait Speed: Normal Gait with Horizontal Head Turns: Normal Gait with Vertical Head Turns: Normal Gait and Pivot Turn: Normal Step Over Obstacle: Normal Step Around Obstacles: Normal Steps: Normal Total Score: 24       Pertinent Vitals/Pain Pain Assessment: No/denies pain    Home Living Family/patient expects to be discharged to:: Private residence Living Arrangements: Alone Available Help at Discharge: Friend(s);Available PRN/intermittently Type of Home: House Home Access: Stairs to enter Entrance Stairs-Rails: None Entrance Stairs-Number of Steps: 2-3 Home Layout: One level Home Equipment: None      Prior Function Level of Independence: Independent         Comments: driving, retired     Higher education careers adviser        Extremity/Trunk Assessment   Upper Extremity Assessment Upper Extremity Assessment: Overall WFL for tasks assessed    Lower Extremity Assessment Lower Extremity Assessment: Overall WFL for tasks assessed    Cervical / Trunk Assessment Cervical / Trunk Assessment: Normal  Communication   Communication: No difficulties  Cognition Arousal/Alertness: Awake/alert Behavior During Therapy: WFL for tasks assessed/performed Overall Cognitive Status: Within Functional Limits for tasks assessed                                        General Comments      Exercises     Assessment/Plan  PT Assessment Patent does not need any further PT services  PT Problem List         PT Treatment Interventions      PT Goals (Current goals can be found in the Care Plan section)  Acute Rehab PT Goals Patient Stated Goal: to go home PT Goal Formulation: All assessment and education complete, DC therapy    Frequency     Barriers to discharge        Co-evaluation               AM-PAC PT "6 Clicks" Mobility  Outcome Measure Help needed turning from your back to your side  while in a flat bed without using bedrails?: None Help needed moving from lying on your back to sitting on the side of a flat bed without using bedrails?: None Help needed moving to and from a bed to a chair (including a wheelchair)?: None Help needed standing up from a chair using your arms (e.g., wheelchair or bedside chair)?: None Help needed to walk in hospital room?: None Help needed climbing 3-5 steps with a railing? : None 6 Click Score: 24    End of Session   Activity Tolerance: Patient tolerated treatment well Patient left: in bed;with call bell/phone within reach Nurse Communication: Mobility status PT Visit Diagnosis: Unsteadiness on feet (R26.81)    Time: 6599-3570 PT Time Calculation (min) (ACUTE ONLY): 15 min   Charges:   PT Evaluation $PT Eval Low Complexity: 1 Low          Myer Bohlman A. Dan Humphreys PT, DPT Acute Rehabilitation Services Pager [phone] Office [phone]   Viviann Spare 03/11/2021, 9:08 AM

## 2021-03-11 NOTE — Progress Notes (Signed)
STROKE TEAM PROGRESS NOTE   INTERVAL HISTORY No acute events  Endorses improvement in speech since yesterday. Still noticing some word finding difficulty.  Per Cardiology note not on [facility] prior to admission d/t low risk CHADSVASC 1.  We discussed stroke diagnosis, plan of care, diagnostic findings and need for anticoagulation. His questions were addressed.  Later, Dr. Rito Ehrlich shared that patient declined statin in earlier discussion. Another discussion held with patient regarding statins. He reports he does not want to take because he believes that they are essentially a hoax they do not work. His concerns were addressed, best evidence based recommendations provided. He is amenable to considering a low dose at least but is unwilling to commit.   Vitals:   03/10/21 2338 03/11/21 0355 03/11/21 0746 03/11/21 1135  BP: 122/68 123/76 126/77 132/80  Pulse: 65 71 (!) 55 74  Resp: Temp: 97.8 F (36.6 C)  97.6 F (36.4 C) 98.1 F (36.7 C)  TempSrc: Oral  Oral Oral  SpO2: 98% 96% 97% 97%  Weight:      Height:       CBC:  Recent Labs  Lab 03/10/21 1538  WBC 10.6*  NEUTROABS 5.9  HGB 14.8  HCT 43.2  MCV 94.7  PLT 306   Basic Metabolic Panel:  Recent Labs  Lab 03/10/21 1538  NA 139  K 3.7  CL 104  CO2 25  GLUCOSE 130*  BUN 15  CREATININE 1.09  CALCIUM 8.8*   Lipid Panel:  Recent Labs  Lab 03/10/21 2310  CHOL 168  TRIG 78  HDL 36*  CHOLHDL 4.7  VLDL 16  LDLCALC 161*   HgbA1c:  Recent Labs  Lab 03/10/21 2310  HGBA1C 6.1*   Urine Drug Screen:  Recent Labs  Lab 03/10/21 1800  LABOPIA NONE DETECTED  COCAINSCRNUR NONE DETECTED  LABBENZ NONE DETECTED  AMPHETMU NONE DETECTED  THCU NONE DETECTED  LABBARB NONE DETECTED    Alcohol Level  Recent Labs  Lab 03/10/21 1556  ETH <10    IMAGING past 24 hours CT ANGIO HEAD W OR WO CONTRAST  Result Date: 03/11/2021 CLINICAL DATA:  Follow-up examination for acute stroke. EXAM: CT ANGIOGRAPHY HEAD AND  NECK TECHNIQUE: Multidetector CT imaging of the head and neck was performed using the standard protocol during bolus administration of intravenous contrast. Multiplanar CT image reconstructions and MIPs were obtained to evaluate the vascular anatomy. Carotid stenosis measurements (when applicable) are obtained utilizing NASCET criteria, using the distal internal carotid diameter as the denominator. CONTRAST:  75mL OMNIPAQUE IOHEXOL 350 MG/ML SOLN COMPARISON:  Prior CT from 03/10/2021 FINDINGS: CTA NECK FINDINGS Aortic arch: Visualized aortic arch normal in caliber. Bovine branching pattern with common origin of the innominate and left common carotid artery noted. Moderate atheromatous change about the visualized aorta and origin of the great vessels without high-grade stenosis. Right carotid system: Right CCA patent from its origin to the bifurcation without stenosis. Calcified plaque about the right carotid bulb without significant stenosis. Right ICA patent distally without stenosis, dissection or occlusion. Left carotid system: Left CCA patent from its origin to the bifurcation without stenosis. Minimal plaque about the left carotid bulb without stenosis. Left ICA patent distally without stenosis, dissection or occlusion. Vertebral arteries: Both vertebral arteries arise from subclavian arteries. No significant proximal subclavian artery stenosis. Vertebral arteries patent without stenosis, dissection or occlusion. Skeleton: No visible acute osseous finding. No discrete or worrisome osseous lesions. Other neck: No other acute soft tissue abnormality  within the neck. 1.6 cm soft tissue nodule present at the inferior right parotid gland (series 6, image 96). Smaller 1 cm ovoid soft tissue nodule present at the inferior left parotid gland (series 6, image 91). No other mass or adenopathy within the neck. Upper chest: Mildly enlarged 1.2 cm node present at the AP window. Additional shotty subcentimeter paratracheal  nodes noted as well. Azygos lobe noted. Subpleural reticular densities within the anterior aspects of both upper lobes noted, suggesting fibrosis. Review of the MIP images confirms the above findings CTA HEAD FINDINGS Anterior circulation: Petrous segments patent bilaterally. Scattered atheromatous changes seen throughout the carotid siphons bilaterally. Associated focal moderate stenosis at the anterior genu of the cavernous right ICA (series 8, image 114). No more than mild narrowing about the left carotid siphon. A1 segments patent bilaterally. Normal anterior communicating artery complex. Anterior cerebral arteries patent to their distal aspects without stenosis. No M1 stenosis or occlusion. Normal MCA bifurcations. Distal MCA branches well perfused and symmetric. Posterior circulation: Both V4 segments patent to the vertebrobasilar junction without stenosis. Both PICA origins patent and normal. Basilar patent to its distal aspect without stenosis. Superior cerebellar arteries patent bilaterally. Both PCAs primarily supplied via the basilar well perfused to their distal aspects. Venous sinuses: Patent allowing for timing the contrast bolus. Anatomic variants: None significant.  No aneurysm. Review of the MIP images confirms the above findings IMPRESSION: 1. Negative CTA for large vessel occlusion. 2. Short-segment moderate stenosis at the anterior genu of the cavernous right ICA. 3. Additional mild-to-moderate atheromatous change about the aortic arch, carotid bifurcations, and carotid siphons. No other hemodynamically significant or correctable stenosis. 4. Bilateral parotid lesions measuring up to 1.6 cm on the right as above, indeterminate. Outpatient ENT referral for further workup and consultation suggested. 5. Mildly enlarged 1.2 cm AP window lymph node, indeterminate, but could be reactive. Electronically Signed   By: Rise Mu M.D.   On: 03/11/2021 03:57   CT ANGIO NECK W OR WO  CONTRAST  Result Date: 03/11/2021 CLINICAL DATA:  Follow-up examination for acute stroke. EXAM: CT ANGIOGRAPHY HEAD AND NECK TECHNIQUE: Multidetector CT imaging of the head and neck was performed using the standard protocol during bolus administration of intravenous contrast. Multiplanar CT image reconstructions and MIPs were obtained to evaluate the vascular anatomy. Carotid stenosis measurements (when applicable) are obtained utilizing NASCET criteria, using the distal internal carotid diameter as the denominator. CONTRAST:  75mL OMNIPAQUE IOHEXOL 350 MG/ML SOLN COMPARISON:  Prior CT from 03/10/2021 FINDINGS: CTA NECK FINDINGS Aortic arch: Visualized aortic arch normal in caliber. Bovine branching pattern with common origin of the innominate and left common carotid artery noted. Moderate atheromatous change about the visualized aorta and origin of the great vessels without high-grade stenosis. Right carotid system: Right CCA patent from its origin to the bifurcation without stenosis. Calcified plaque about the right carotid bulb without significant stenosis. Right ICA patent distally without stenosis, dissection or occlusion. Left carotid system: Left CCA patent from its origin to the bifurcation without stenosis. Minimal plaque about the left carotid bulb without stenosis. Left ICA patent distally without stenosis, dissection or occlusion. Vertebral arteries: Both vertebral arteries arise from subclavian arteries. No significant proximal subclavian artery stenosis. Vertebral arteries patent without stenosis, dissection or occlusion. Skeleton: No visible acute osseous finding. No discrete or worrisome osseous lesions. Other neck: No other acute soft tissue abnormality within the neck. 1.6 cm soft tissue nodule present at the inferior right parotid gland (series 6, image 96). Smaller  1 cm ovoid soft tissue nodule present at the inferior left parotid gland (series 6, image 91). No other mass or adenopathy within  the neck. Upper chest: Mildly enlarged 1.2 cm node present at the AP window. Additional shotty subcentimeter paratracheal nodes noted as well. Azygos lobe noted. Subpleural reticular densities within the anterior aspects of both upper lobes noted, suggesting fibrosis. Review of the MIP images confirms the above findings CTA HEAD FINDINGS Anterior circulation: Petrous segments patent bilaterally. Scattered atheromatous changes seen throughout the carotid siphons bilaterally. Associated focal moderate stenosis at the anterior genu of the cavernous right ICA (series 8, image 114). No more than mild narrowing about the left carotid siphon. A1 segments patent bilaterally. Normal anterior communicating artery complex. Anterior cerebral arteries patent to their distal aspects without stenosis. No M1 stenosis or occlusion. Normal MCA bifurcations. Distal MCA branches well perfused and symmetric. Posterior circulation: Both V4 segments patent to the vertebrobasilar junction without stenosis. Both PICA origins patent and normal. Basilar patent to its distal aspect without stenosis. Superior cerebellar arteries patent bilaterally. Both PCAs primarily supplied via the basilar well perfused to their distal aspects. Venous sinuses: Patent allowing for timing the contrast bolus. Anatomic variants: None significant.  No aneurysm. Review of the MIP images confirms the above findings IMPRESSION: 1. Negative CTA for large vessel occlusion. 2. Short-segment moderate stenosis at the anterior genu of the cavernous right ICA. 3. Additional mild-to-moderate atheromatous change about the aortic arch, carotid bifurcations, and carotid siphons. No other hemodynamically significant or correctable stenosis. 4. Bilateral parotid lesions measuring up to 1.6 cm on the right as above, indeterminate. Outpatient ENT referral for further workup and consultation suggested. 5. Mildly enlarged 1.2 cm AP window lymph node, indeterminate, but could be  reactive. Electronically Signed   By: Rise Mu M.D.   On: 03/11/2021 03:57   MR BRAIN WO CONTRAST  Result Date: 03/11/2021 CLINICAL DATA:  Initial evaluation for acute stroke. EXAM: MRI HEAD WITHOUT CONTRAST TECHNIQUE: Multiplanar, multiecho pulse sequences of the brain and surrounding structures were obtained without intravenous contrast. COMPARISON:  Prior CT from 03/10/2021 FINDINGS: Brain: Cerebral volume within normal limits. No significant cerebral white matter disease for age. Chronic right PCA distribution infarct with associated hemosiderin staining. Area of diffusion abnormality involving the left insula and overlying left frontal operculum, consistent with a subacute left MCA distribution infarct. Associated petechial hemorrhage without frank hemorrhagic transformation or significant regional mass effect. No other evidence for acute or subacute ischemia. Gray-white matter differentiation otherwise maintained. No mass lesion, midline shift or mass effect. Mild ventricular prominence related to global parenchymal volume loss of hydrocephalus. No extra-axial fluid collection. Pituitary gland within normal limits. Midline structures intact. Vascular: Major intracranial vascular flow voids maintained. Skull and upper cervical spine: Craniocervical junction within normal limits. Bone marrow signal intensity normal. No scalp soft tissue abnormality. Sinuses/Orbits: Globes and orbital soft tissues within normal limits. Scattered mucosal thickening within the ethmoidal air cells. No mastoid effusion. Other: 1.6 cm T2 hypointense lesion present within the inferior right parotid gland. Additional probable similar lesion measuring 1.1 cm within the contralateral left parotid gland. IMPRESSION: 1. Subacute left MCA distribution infarct involving the left insula and overlying left frontal operculum. Associated petechial hemorrhage without frank hemorrhagic transformation or significant regional mass  effect. 2. Chronic right PCA distribution infarct. 3. T2 hypointense lesions measuring up to 1.6 cm involving the bilateral parotid glands, indeterminate. Outpatient ENT referral for further workup and consultation suggested. Electronically Signed   By: Sharlet Salina  Phill Myron M.D.   On: 03/11/2021 02:57   ECHOCARDIOGRAM COMPLETE  Result Date: 03/11/2021    ECHOCARDIOGRAM REPORT   Patient Name:   Cody Whitney Date of Exam: 03/11/2021 Medical Rec #:  [id number]         Height:       72.0 in Accession #:    [id number]        Weight:       220.0 lb Date of Birth:  May 09, 1951          BSA:          2.219 m Patient Age:    70 years          BP:           123/76 mmHg Patient Gender: M                 HR:           74 bpm. Exam Location:  Inpatient Procedure: 2D Echo, Cardiac Doppler and Color Doppler Indications:    CVA  History:        Patient has no prior history of Echocardiogram examinations.                 Arrythmias:Atrial Fibrillation.  Sonographer:    Neomia Dear RDCS Referring Phys: [id number] Deno Lunger SHAL[place] IMPRESSIONS  1. Left ventricular ejection fraction, by estimation, is 55 to 60%. The left ventricle has normal function. The left ventricle has no regional wall motion abnormalities. Left ventricular diastolic parameters are indeterminate.  2. Right ventricular systolic function is normal. The right ventricular size is normal. There is normal pulmonary artery systolic pressure. The estimated right ventricular systolic pressure is 24.2 mmHg.  3. Left atrial size was mildly dilated.  4. Right atrial size was mildly dilated.  5. The mitral valve is normal in structure. Trivial mitral valve regurgitation. No evidence of mitral stenosis.  6. The aortic valve is tricuspid. Aortic valve regurgitation is not visualized. Mild aortic valve sclerosis is present, with no evidence of aortic valve stenosis.  7. Aortic dilatation noted. There is mild dilatation of the aortic root, measuring 38 mm.  8. The inferior vena  cava is normal in size with greater than 50% respiratory variability, suggesting right atrial pressure of 3 mmHg.  9. The patient appeared to be in atrial fibrillation. FINDINGS  Left Ventricle: Left ventricular ejection fraction, by estimation, is 55 to 60%. The left ventricle has normal function. The left ventricle has no regional wall motion abnormalities. The left ventricular internal cavity size was normal in size. There is  no left ventricular hypertrophy. Left ventricular diastolic parameters are indeterminate. Right Ventricle: The right ventricular size is normal. No increase in right ventricular wall thickness. Right ventricular systolic function is normal. There is normal pulmonary artery systolic pressure. The tricuspid regurgitant velocity is 2.30 m/s, and  with an assumed right atrial pressure of 3 mmHg, the estimated right ventricular systolic pressure is 24.2 mmHg. Left Atrium: Left atrial size was mildly dilated. Right Atrium: Right atrial size was mildly dilated. Pericardium: There is no evidence of pericardial effusion. Mitral Valve: The mitral valve is normal in structure. There is mild calcification of the mitral valve leaflet(s). Trivial mitral valve regurgitation. No evidence of mitral valve stenosis. Tricuspid Valve: The tricuspid valve is normal in structure. Tricuspid valve regurgitation is trivial. Aortic Valve: The aortic valve is tricuspid. Aortic valve regurgitation is not visualized. Mild aortic valve sclerosis is present, with no evidence of  aortic valve stenosis. Aortic valve mean gradient measures 3.0 mmHg. Aortic valve peak gradient measures 5.3 mmHg. Aortic valve area, by VTI measures 4.00 cm. Pulmonic Valve: The pulmonic valve was normal in structure. Pulmonic valve regurgitation is not visualized. Aorta: Aortic dilatation noted. There is mild dilatation of the aortic root, measuring 38 mm. Venous: The inferior vena cava is normal in size with greater than 50% respiratory  variability, suggesting right atrial pressure of 3 mmHg. IAS/Shunts: No atrial level shunt detected by color flow Doppler.  LEFT VENTRICLE PLAX 2D LVIDd:         4.30 cm LVIDs:         1.65 cm LV PW:         1.20 cm LV IVS:        1.10 cm LVOT diam:     2.50 cm LV SV:         91 LV SV Index:   41 LVOT Area:     4.91 cm  LV Volumes (MOD) LV vol d, MOD A2C: 49.9 ml LV vol d, MOD A4C: 63.3 ml LV vol s, MOD A2C: 22.2 ml LV vol s, MOD A4C: 24.5 ml LV SV MOD A2C:     27.7 ml LV SV MOD A4C:     63.3 ml LV SV MOD BP:      33.1 ml RIGHT VENTRICLE RV S prime:     14.00 cm/s TAPSE (M-mode): 1.8 cm LEFT ATRIUM             Index       RIGHT ATRIUM           Index LA Vol (A2C):   74.4 ml 33.53 ml/m RA Area:     23.40 cm LA Vol (A4C):   81.7 ml 36.82 ml/m RA Volume:   69.90 ml  31.51 ml/m LA Biplane Vol: 78.9 ml 35.56 ml/m  AORTIC VALVE                   PULMONIC VALVE AV Area (Vmax):    3.63 cm    PV Vmax:       0.84 m/s AV Area (Vmean):   3.70 cm    PV Vmean:      55.400 cm/s AV Area (VTI):     4.00 cm    PV VTI:        0.181 m AV Vmax:           115.00 cm/s PV Peak grad:  2.8 mmHg AV Vmean:          82.700 cm/s PV Mean grad:  1.0 mmHg AV VTI:            0.227 m AV Peak Grad:      5.3 mmHg AV Mean Grad:      3.0 mmHg LVOT Vmax:         85.00 cm/s LVOT Vmean:        62.400 cm/s LVOT VTI:          0.185 m LVOT/AV VTI ratio: 0.81  AORTA Ao Root diam: 3.80 cm Ao Asc diam:  3.60 cm MITRAL VALVE                TRICUSPID VALVE MV Area (PHT): 4.89 cm     TR Peak grad:   21.2 mmHg MV Decel Time: 155 msec     TR Vmax:        230.00 cm/s MR Peak grad: 66.6 mmHg MR Vmax:  408.00 cm/s   SHUNTS MV E velocity: 106.00 cm/s  Systemic VTI:  0.18 m MV A velocity: 67.90 cm/s   Systemic Diam: 2.50 cm MV E/A ratio:  1.56 Marca Ancona MD Electronically signed by Marca Ancona MD Signature Date/Time: 03/11/2021/1:28:28 PM    Final    PHYSICAL EXAM Constitutional: Pleasant elderly Caucasian male appears well-developed and  well-nourished.  Not in distress Psych: Affect appropriate to situation Eyes: No scleral injection HENT: No OP obstruction MSK: no joint deformities.  Cardiovascular: Normal rate and regular rhythm.  Respiratory: Effort normal, non-labored breathing GI: Soft.  No distension. There is no tenderness.  Skin: WDI  Neuro: Mental Status: Patient is awake, alert, oriented to person, place, month, year, and situation. Patient is able to give a clear and coherent history. No signs of aphasia or neglect able to name only 9 animals which can walk on 4 legs.  No difficulty with naming objects, repetition comprehension and spontaneous speech Cranial Nerves: II: Visual Fields are full. Pupils are equal, round, and reactive to light.   III,IV, VI: EOMI without ptosis or diploplia.  V: Facial sensation is symmetric to temperature VII: Facial movement with possible mild left facial weakness VIII: hearing is intact to voice X: Uvula elevates symmetrically XI: Shoulder shrug is symmetric. XII: tongue is midline without atrophy or fasciculations.  Motor: Tone is normal. Bulk is normal. 5/5 strength was present on the right side, he has mild impairment of his fine motor movements on the left and orbits the right around the left.  No drift in left lower extremity, but 4+/5 to confrontation Sensory: Sensation is symmetric to light touch and temperature in the arms and legs.  He does not extinguish Cerebellar: FNF slightly impaired on the left  ASSESSMENT/PLAN  70 yo male with PMHx of A-fib, HLD, HTN, migraines who presented  from [facility] with word finding difficulties since 5/9. He was found to have a subacute left MCA distribution infarct involving the left insula and overlying left frontal operculum noted on MRI likely due to atrial fibrillation not on anticoagulation.    Code Stroke: 3.5 cm acute/early subacute left MCA infarct  CTA head & neck:  Negative CTA for large vessel  occlusion  2D Echo: EF 55-60%,  Left atrial size was mildly dilated  LDL 116  HgbA1c 6.1    Diet   Diet Heart Room service appropriate? Yes; Fluid consistency: Thin    No AC/AP prior to admission due to low risk as documented by cardiology  Recommend Eliquis initiation for atrial fibrillation with cardiology follow up.   Therapy recommendations:  Home   Disposition:  Home    Hypertension . Permissive hypertension (OK if < 220/120) but gradually normalize in 5-7 days . Long-term BP goal normotensive  Hyperlipidemia  Home meds: None  LDL 116, goal < 70  High intensity statin was refused by patient. After further discussion he agrees to consider trying at least a low dose statin but wants time to think about it.   Continue statin at discharge  Atrial fibrillation   Recommend Eliquis 5mg  BID   Cardiology follow up, already established   Other Stroke Risk Factors  Advanced Age >/= 42   Current Cigarette smoker, advised to stop smoking  Overweight,  Body mass index is 29.84 kg/m., BMI >/= 30 associated with increased stroke risk, recommend weight loss, diet and exercise as appropriate   Hx of stroke: Chronic right PCA distribution infarct seen on imaging   Other  Active Problems     Hospital day # 1  I have personally obtained history,examined this patient, reviewed notes, independently viewed imaging studies, participated in medical decision making and plan of care.ROS completed by me personally and pertinent positives fully documented  I have made any additions or clarifications directly to the above note. Agree with note above.  Patient presented with transient expressive aphasia secondary to left MCA branch infarct likely from underlying atrial fibrillation and he was not on long-term anticoagulation given his low CHA2DS2-VASc score of 1.  Given he also has an old silent right occipital infarct on the MRI he needs to be on long-term anticoagulation.  Recommend  Eliquis 5 mg twice daily.  Discussed risk benefit with patient and answered questions.  Continue ongoing stroke work-up and aggressive risk factor modification.  Discussed with patient and Dr. Krishnan.  Greater than 50% time during this 35-minute visit was speRito Ehrlichnt in counseling coordination of care and discussion with care team.  Follow-up as an outpatient stroke clinic in 6 weeks  Delia HeadyPramod Shellene Sweigert, MD Medical Director [facility] Pager: [phone] 03/11/2021 4:46 PM   To contact Stroke Continuity provider, please refer to [email or web address]. After hours, contact General Neurology

## 2021-03-11 NOTE — Discharge Instructions (Signed)

## 2021-03-11 NOTE — Discharge Summary (Signed)
[facility]  Physician Discharge Summary   Patient ID: Cody Whitney MRN: [id number] DOB/AGE: 1950/12/02 70 y.o.  Admit date: 03/10/2021 Discharge date: 03/11/2021  PCP: Catha Gosselin, MD  DISCHARGE DIAGNOSES:  Acute Embolic CVA Chronic atrial fibrillation Essential hypertension Hyperlipidemia  RECOMMENDATIONS FOR OUTPATIENT FOLLOW UP: 1. Ambulatory referral sent to neurology 2. Patient started on apixaban 3. Patient encouraged to take statin medication for hyperlipidemia    Home Health: None Equipment/Devices: None  CODE STATUS: Full code  DISCHARGE CONDITION: fair  Diet recommendation: Heart healthy  INITIAL HISTORY: 70 year old male with past medical history of chronic atrial fibrillation, hyperlipidemia, hypertension and nicotine dependence who presents to [facility] ED with complaints of episodic aphasia.  He was hospitalized for further management.  Consultations:  Neurology  Procedures:  Transthoracic echocardiogram   HOSPITAL COURSE:   Acute embolic stroke Patient underwent imaging studies which showed acute infarct in the left MCA territory.  Patient was seen by neurology.  Underwent CT angiogram and echocardiogram.  LDL was 116.  HbA1c 6.1.  Imaging studies did not show any large vessel occlusion.  Patient was offered statin medication which he refused initially saying that he did not believe in these medications.  After further education patient was willing to try low-dose.  Started on atorvastatin.  Stroke is thought to be embolic.  Patient with history of atrial fibrillation not on anticoagulation due to previously low CHADS2 vascular score.  Now due to stroke he will need to be anticoagulated.  Started on apixaban.  He may continue his other medications.  Seen by physical and Occupational Therapy.  No needs identified.  Aphasia has improved.  Seen by the stroke service this morning.  Cleared for discharge.  Okay for discharge  today.   PERTINENT LABS:  The results of significant diagnostics from this hospitalization (including imaging, microbiology, ancillary and laboratory) are listed below for reference.    Microbiology: Recent Results (from the past 240 hour(s))  Resp Panel by RT-PCR (Flu A&B, Covid) Nasopharyngeal Swab     Status: None   Collection Time: 03/10/21  3:56 PM   Specimen: Nasopharyngeal Swab; Nasopharyngeal(NP) swabs in vial transport medium  Result Value Ref Range Status   SARS Coronavirus 2 by RT PCR NEGATIVE NEGATIVE Final    Comment: (NOTE) SARS-CoV-2 target nucleic acids are NOT DETECTED.  The SARS-CoV-2 RNA is generally detectable in upper respiratory specimens during the acute phase of infection. The lowest concentration of SARS-CoV-2 viral copies this assay can detect is 138 copies/mL. A negative result does not preclude SARS-Cov-2 infection and should not be used as the sole basis for treatment or other patient management decisions. A negative result may occur with  improper specimen collection/handling, submission of specimen other than nasopharyngeal swab, presence of viral mutation(s) within the areas targeted by this assay, and inadequate number of viral copies(<138 copies/mL). A negative result must be combined with clinical observations, patient history, and epidemiological information. The expected result is Negative.  Fact Sheet for Patients:  BloggerCourse.com  Fact Sheet for Healthcare Providers:  SeriousBroker.it  This test is no t yet approved or cleared by the [place] FDA and  has been authorized for detection and/or diagnosis of SARS-CoV-2 by FDA under an Emergency Use Authorization (EUA). This EUA will remain  in effect (meaning this test can be used) for the duration of the COVID-19 declaration under Section 564(b)(1) of the Act, 21 U.S.C.section 360bbb-3(b)(1), unless the authorization is terminated   or revoked sooner.  Influenza A by PCR NEGATIVE NEGATIVE Final   Influenza B by PCR NEGATIVE NEGATIVE Final    Comment: (NOTE) The Xpert Xpress SARS-CoV-2/FLU/RSV plus assay is intended as an aid in the diagnosis of influenza from Nasopharyngeal swab specimens and should not be used as a sole basis for treatment. Nasal washings and aspirates are unacceptable for Xpert Xpress SARS-CoV-2/FLU/RSV testing.  Fact Sheet for Patients: BloggerCourse.comhttps://www.fda.gov/media/152166/download  Fact Sheet for Healthcare Providers: SeriousBroker.ithttps://www.fda.gov/media/152162/download  This test is not yet approved or cleared by the [place] FDA and has been authorized for detection and/or diagnosis of SARS-CoV-2 by FDA under an Emergency Use Authorization (EUA). This EUA will remain in effect (meaning this test can be used) for the duration of the COVID-19 declaration under Section 564(b)(1) of the Act, 21 U.S.C. section 360bbb-3(b)(1), unless the authorization is terminated or revoked.  Performed at [facility], [address], [place], [place] [id number]      Labs:  COVID-19 Labs   Lab Results  Component Value Date   SARSCOV2NAA NEGATIVE 03/10/2021      Basic Metabolic Panel: Recent Labs  Lab 03/10/21 1538  NA 139  K 3.7  CL 104  CO2 25  GLUCOSE 130*  BUN 15  CREATININE 1.09  CALCIUM 8.8*   Liver Function Tests: Recent Labs  Lab 03/10/21 1538  AST 17  ALT 19  ALKPHOS 52  BILITOT 0.4  PROT 6.8  ALBUMIN 4.0   CBC: Recent Labs  Lab 03/10/21 1538  WBC 10.6*  NEUTROABS 5.9  HGB 14.8  HCT 43.2  MCV 94.7  PLT 306     IMAGING STUDIES CT ANGIO HEAD W OR WO CONTRAST  Result Date: 03/11/2021 CLINICAL DATA:  Follow-up examination for acute stroke. EXAM: CT ANGIOGRAPHY HEAD AND NECK TECHNIQUE: Multidetector CT imaging of the head and neck was performed using the standard protocol during bolus administration of intravenous contrast. Multiplanar CT  image reconstructions and MIPs were obtained to evaluate the vascular anatomy. Carotid stenosis measurements (when applicable) are obtained utilizing NASCET criteria, using the distal internal carotid diameter as the denominator. CONTRAST:  75mL OMNIPAQUE IOHEXOL 350 MG/ML SOLN COMPARISON:  Prior CT from 03/10/2021 FINDINGS: CTA NECK FINDINGS Aortic arch: Visualized aortic arch normal in caliber. Bovine branching pattern with common origin of the innominate and left common carotid artery noted. Moderate atheromatous change about the visualized aorta and origin of the great vessels without high-grade stenosis. Right carotid system: Right CCA patent from its origin to the bifurcation without stenosis. Calcified plaque about the right carotid bulb without significant stenosis. Right ICA patent distally without stenosis, dissection or occlusion. Left carotid system: Left CCA patent from its origin to the bifurcation without stenosis. Minimal plaque about the left carotid bulb without stenosis. Left ICA patent distally without stenosis, dissection or occlusion. Vertebral arteries: Both vertebral arteries arise from subclavian arteries. No significant proximal subclavian artery stenosis. Vertebral arteries patent without stenosis, dissection or occlusion. Skeleton: No visible acute osseous finding. No discrete or worrisome osseous lesions. Other neck: No other acute soft tissue abnormality within the neck. 1.6 cm soft tissue nodule present at the inferior right parotid gland (series 6, image 96). Smaller 1 cm ovoid soft tissue nodule present at the inferior left parotid gland (series 6, image 91). No other mass or adenopathy within the neck. Upper chest: Mildly enlarged 1.2 cm node present at the AP window. Additional shotty subcentimeter paratracheal nodes noted as well. Azygos lobe noted. Subpleural reticular densities within the anterior aspects of both  upper lobes noted, suggesting fibrosis. Review of the MIP images  confirms the above findings CTA HEAD FINDINGS Anterior circulation: Petrous segments patent bilaterally. Scattered atheromatous changes seen throughout the carotid siphons bilaterally. Associated focal moderate stenosis at the anterior genu of the cavernous right ICA (series 8, image 114). No more than mild narrowing about the left carotid siphon. A1 segments patent bilaterally. Normal anterior communicating artery complex. Anterior cerebral arteries patent to their distal aspects without stenosis. No M1 stenosis or occlusion. Normal MCA bifurcations. Distal MCA branches well perfused and symmetric. Posterior circulation: Both V4 segments patent to the vertebrobasilar junction without stenosis. Both PICA origins patent and normal. Basilar patent to its distal aspect without stenosis. Superior cerebellar arteries patent bilaterally. Both PCAs primarily supplied via the basilar well perfused to their distal aspects. Venous sinuses: Patent allowing for timing the contrast bolus. Anatomic variants: None significant.  No aneurysm. Review of the MIP images confirms the above findings IMPRESSION: 1. Negative CTA for large vessel occlusion. 2. Short-segment moderate stenosis at the anterior genu of the cavernous right ICA. 3. Additional mild-to-moderate atheromatous change about the aortic arch, carotid bifurcations, and carotid siphons. No other hemodynamically significant or correctable stenosis. 4. Bilateral parotid lesions measuring up to 1.6 cm on the right as above, indeterminate. Outpatient ENT referral for further workup and consultation suggested. 5. Mildly enlarged 1.2 cm AP window lymph node, indeterminate, but could be reactive. Electronically Signed   By: Rise Mu M.D.   On: 03/11/2021 03:57   CT HEAD WO CONTRAST  Result Date: 03/10/2021 CLINICAL DATA:  Speech disturbance. EXAM: CT HEAD WITHOUT CONTRAST TECHNIQUE: Contiguous axial images were obtained from the base of the skull through the  vertex without intravenous contrast. COMPARISON:  06/14/2017 FINDINGS: Brain: There is a 3.5 cm acute or early subacute left MCA infarct involving the frontal operculum and insula, possibly with trace petechial hemorrhage. No malignant hemorrhagic transformation is present. Mild lateral and third ventriculomegaly has slightly progressed and is favored to reflect central predominant cerebral atrophy rather than hydrocephalus. Hypodensities in the cerebral white matter bilaterally are nonspecific but compatible with mild chronic small vessel ischemic disease. A tiny chronic right cerebellar infarct is unchanged. There is no midline shift or extra-axial fluid collection. Vascular: Calcified atherosclerosis at the skull base. No definite acutely hyperdense vessel. Skull: No fracture or suspicious osseous lesion. Sinuses/Orbits: Moderate anterior ethmoid air cell mucosal thickening bilaterally. Clear mastoid air cells. Unremarkable orbits. Other: None. IMPRESSION: 1. 3.5 cm acute/early subacute left MCA infarct. 2. Mild chronic small vessel ischemic disease and cerebral atrophy. Electronically Signed   By: Sebastian Ache M.D.   On: 03/10/2021 16:31   CT ANGIO NECK W OR WO CONTRAST  Result Date: 03/11/2021 CLINICAL DATA:  Follow-up examination for acute stroke. EXAM: CT ANGIOGRAPHY HEAD AND NECK TECHNIQUE: Multidetector CT imaging of the head and neck was performed using the standard protocol during bolus administration of intravenous contrast. Multiplanar CT image reconstructions and MIPs were obtained to evaluate the vascular anatomy. Carotid stenosis measurements (when applicable) are obtained utilizing NASCET criteria, using the distal internal carotid diameter as the denominator. CONTRAST:  75mL OMNIPAQUE IOHEXOL 350 MG/ML SOLN COMPARISON:  Prior CT from 03/10/2021 FINDINGS: CTA NECK FINDINGS Aortic arch: Visualized aortic arch normal in caliber. Bovine branching pattern with common origin of the innominate and  left common carotid artery noted. Moderate atheromatous change about the visualized aorta and origin of the great vessels without high-grade stenosis. Right carotid system: Right CCA patent from its  origin to the bifurcation without stenosis. Calcified plaque about the right carotid bulb without significant stenosis. Right ICA patent distally without stenosis, dissection or occlusion. Left carotid system: Left CCA patent from its origin to the bifurcation without stenosis. Minimal plaque about the left carotid bulb without stenosis. Left ICA patent distally without stenosis, dissection or occlusion. Vertebral arteries: Both vertebral arteries arise from subclavian arteries. No significant proximal subclavian artery stenosis. Vertebral arteries patent without stenosis, dissection or occlusion. Skeleton: No visible acute osseous finding. No discrete or worrisome osseous lesions. Other neck: No other acute soft tissue abnormality within the neck. 1.6 cm soft tissue nodule present at the inferior right parotid gland (series 6, image 96). Smaller 1 cm ovoid soft tissue nodule present at the inferior left parotid gland (series 6, image 91). No other mass or adenopathy within the neck. Upper chest: Mildly enlarged 1.2 cm node present at the AP window. Additional shotty subcentimeter paratracheal nodes noted as well. Azygos lobe noted. Subpleural reticular densities within the anterior aspects of both upper lobes noted, suggesting fibrosis. Review of the MIP images confirms the above findings CTA HEAD FINDINGS Anterior circulation: Petrous segments patent bilaterally. Scattered atheromatous changes seen throughout the carotid siphons bilaterally. Associated focal moderate stenosis at the anterior genu of the cavernous right ICA (series 8, image 114). No more than mild narrowing about the left carotid siphon. A1 segments patent bilaterally. Normal anterior communicating artery complex. Anterior cerebral arteries patent to  their distal aspects without stenosis. No M1 stenosis or occlusion. Normal MCA bifurcations. Distal MCA branches well perfused and symmetric. Posterior circulation: Both V4 segments patent to the vertebrobasilar junction without stenosis. Both PICA origins patent and normal. Basilar patent to its distal aspect without stenosis. Superior cerebellar arteries patent bilaterally. Both PCAs primarily supplied via the basilar well perfused to their distal aspects. Venous sinuses: Patent allowing for timing the contrast bolus. Anatomic variants: None significant.  No aneurysm. Review of the MIP images confirms the above findings IMPRESSION: 1. Negative CTA for large vessel occlusion. 2. Short-segment moderate stenosis at the anterior genu of the cavernous right ICA. 3. Additional mild-to-moderate atheromatous change about the aortic arch, carotid bifurcations, and carotid siphons. No other hemodynamically significant or correctable stenosis. 4. Bilateral parotid lesions measuring up to 1.6 cm on the right as above, indeterminate. Outpatient ENT referral for further workup and consultation suggested. 5. Mildly enlarged 1.2 cm AP window lymph node, indeterminate, but could be reactive. Electronically Signed   By: Rise Mu M.D.   On: 03/11/2021 03:57   MR BRAIN WO CONTRAST  Result Date: 03/11/2021 CLINICAL DATA:  Initial evaluation for acute stroke. EXAM: MRI HEAD WITHOUT CONTRAST TECHNIQUE: Multiplanar, multiecho pulse sequences of the brain and surrounding structures were obtained without intravenous contrast. COMPARISON:  Prior CT from 03/10/2021 FINDINGS: Brain: Cerebral volume within normal limits. No significant cerebral white matter disease for age. Chronic right PCA distribution infarct with associated hemosiderin staining. Area of diffusion abnormality involving the left insula and overlying left frontal operculum, consistent with a subacute left MCA distribution infarct. Associated petechial  hemorrhage without frank hemorrhagic transformation or significant regional mass effect. No other evidence for acute or subacute ischemia. Gray-white matter differentiation otherwise maintained. No mass lesion, midline shift or mass effect. Mild ventricular prominence related to global parenchymal volume loss of hydrocephalus. No extra-axial fluid collection. Pituitary gland within normal limits. Midline structures intact. Vascular: Major intracranial vascular flow voids maintained. Skull and upper cervical spine: Craniocervical junction within normal limits. Bone marrow  signal intensity normal. No scalp soft tissue abnormality. Sinuses/Orbits: Globes and orbital soft tissues within normal limits. Scattered mucosal thickening within the ethmoidal air cells. No mastoid effusion. Other: 1.6 cm T2 hypointense lesion present within the inferior right parotid gland. Additional probable similar lesion measuring 1.1 cm within the contralateral left parotid gland. IMPRESSION: 1. Subacute left MCA distribution infarct involving the left insula and overlying left frontal operculum. Associated petechial hemorrhage without frank hemorrhagic transformation or significant regional mass effect. 2. Chronic right PCA distribution infarct. 3. T2 hypointense lesions measuring up to 1.6 cm involving the bilateral parotid glands, indeterminate. Outpatient ENT referral for further workup and consultation suggested. Electronically Signed   By: Rise Mu M.D.   On: 03/11/2021 02:57   ECHOCARDIOGRAM COMPLETE  Result Date: 03/11/2021    ECHOCARDIOGRAM REPORT   Patient Name:   LAURIS SERVISS Date of Exam: 03/11/2021 Medical Rec #:  [id number]         Height:       72.0 in Accession #:    [id number]        Weight:       220.0 lb Date of Birth:  1950/11/17          BSA:          2.219 m Patient Age:    70 years          BP:           123/76 mmHg Patient Gender: M                 HR:           74 bpm. Exam Location:  Inpatient  Procedure: 2D Echo, Cardiac Doppler and Color Doppler Indications:    CVA  History:        Patient has no prior history of Echocardiogram examinations.                 Arrythmias:Atrial Fibrillation.  Sonographer:    Neomia Dear RDCS Referring Phys: [id number] Deno Lunger [place] IMPRESSIONS  1. Left ventricular ejection fraction, by estimation, is 55 to 60%. The left ventricle has normal function. The left ventricle has no regional wall motion abnormalities. Left ventricular diastolic parameters are indeterminate.  2. Right ventricular systolic function is normal. The right ventricular size is normal. There is normal pulmonary artery systolic pressure. The estimated right ventricular systolic pressure is 24.2 mmHg.  3. Left atrial size was mildly dilated.  4. Right atrial size was mildly dilated.  5. The mitral valve is normal in structure. Trivial mitral valve regurgitation. No evidence of mitral stenosis.  6. The aortic valve is tricuspid. Aortic valve regurgitation is not visualized. Mild aortic valve sclerosis is present, with no evidence of aortic valve stenosis.  7. Aortic dilatation noted. There is mild dilatation of the aortic root, measuring 38 mm.  8. The inferior vena cava is normal in size with greater than 50% respiratory variability, suggesting right atrial pressure of 3 mmHg.  9. The patient appeared to be in atrial fibrillation. FINDINGS  Left Ventricle: Left ventricular ejection fraction, by estimation, is 55 to 60%. The left ventricle has normal function. The left ventricle has no regional wall motion abnormalities. The left ventricular internal cavity size was normal in size. There is  no left ventricular hypertrophy. Left ventricular diastolic parameters are indeterminate. Right Ventricle: The right ventricular size is normal. No increase in right ventricular wall thickness. Right ventricular systolic function is normal. There is normal pulmonary  artery systolic pressure. The tricuspid regurgitant  velocity is 2.30 m/s, and  with an assumed right atrial pressure of 3 mmHg, the estimated right ventricular systolic pressure is 24.2 mmHg. Left Atrium: Left atrial size was mildly dilated. Right Atrium: Right atrial size was mildly dilated. Pericardium: There is no evidence of pericardial effusion. Mitral Valve: The mitral valve is normal in structure. There is mild calcification of the mitral valve leaflet(s). Trivial mitral valve regurgitation. No evidence of mitral valve stenosis. Tricuspid Valve: The tricuspid valve is normal in structure. Tricuspid valve regurgitation is trivial. Aortic Valve: The aortic valve is tricuspid. Aortic valve regurgitation is not visualized. Mild aortic valve sclerosis is present, with no evidence of aortic valve stenosis. Aortic valve mean gradient measures 3.0 mmHg. Aortic valve peak gradient measures 5.3 mmHg. Aortic valve area, by VTI measures 4.00 cm. Pulmonic Valve: The pulmonic valve was normal in structure. Pulmonic valve regurgitation is not visualized. Aorta: Aortic dilatation noted. There is mild dilatation of the aortic root, measuring 38 mm. Venous: The inferior vena cava is normal in size with greater than 50% respiratory variability, suggesting right atrial pressure of 3 mmHg. IAS/Shunts: No atrial level shunt detected by color flow Doppler.  LEFT VENTRICLE PLAX 2D LVIDd:         4.30 cm LVIDs:         1.65 cm LV PW:         1.20 cm LV IVS:        1.10 cm LVOT diam:     2.50 cm LV SV:         91 LV SV Index:   41 LVOT Area:     4.91 cm  LV Volumes (MOD) LV vol d, MOD A2C: 49.9 ml LV vol d, MOD A4C: 63.3 ml LV vol s, MOD A2C: 22.2 ml LV vol s, MOD A4C: 24.5 ml LV SV MOD A2C:     27.7 ml LV SV MOD A4C:     63.3 ml LV SV MOD BP:      33.1 ml RIGHT VENTRICLE RV S prime:     14.00 cm/s TAPSE (M-mode): 1.8 cm LEFT ATRIUM             Index       RIGHT ATRIUM           Index LA Vol (A2C):   74.4 ml 33.53 ml/m RA Area:     23.40 cm LA Vol (A4C):   81.7 ml 36.82 ml/m RA  Volume:   69.90 ml  31.51 ml/m LA Biplane Vol: 78.9 ml 35.56 ml/m  AORTIC VALVE                   PULMONIC VALVE AV Area (Vmax):    3.63 cm    PV Vmax:       0.84 m/s AV Area (Vmean):   3.70 cm    PV Vmean:      55.400 cm/s AV Area (VTI):     4.00 cm    PV VTI:        0.181 m AV Vmax:           115.00 cm/s PV Peak grad:  2.8 mmHg AV Vmean:          82.700 cm/s PV Mean grad:  1.0 mmHg AV VTI:            0.227 m AV Peak Grad:      5.3 mmHg AV Mean Grad:  3.0 mmHg LVOT Vmax:         85.00 cm/s LVOT Vmean:        62.400 cm/s LVOT VTI:          0.185 m LVOT/AV VTI ratio: 0.81  AORTA Ao Root diam: 3.80 cm Ao Asc diam:  3.60 cm MITRAL VALVE                TRICUSPID VALVE MV Area (PHT): 4.89 cm     TR Peak grad:   21.2 mmHg MV Decel Time: 155 msec     TR Vmax:        230.00 cm/s MR Peak grad: 66.6 mmHg MR Vmax:      408.00 cm/s   SHUNTS MV E velocity: 106.00 cm/s  Systemic VTI:  0.18 m MV A velocity: 67.90 cm/s   Systemic Diam: 2.50 cm MV E/A ratio:  1.56 Marca Ancona MD Electronically signed by Marca Ancona MD Signature Date/Time: 03/11/2021/1:28:28 PM    Final     DISCHARGE EXAMINATION: Vitals:   03/10/21 2338 03/11/21 0355 03/11/21 0746 03/11/21 1135  BP: 122/68 123/76 126/77 132/80  Pulse: 65 71 (!) 55 74  Resp: 16 19 20 18   Temp: 97.8 F (36.6 C)  97.6 F (36.4 C) 98.1 F (36.7 C)  TempSrc: Oral  Oral Oral  SpO2: 98% 96% 97% 97%  Weight:      Height:       General appearance: Awake alert.  In no distress Resp: Clear to auscultation bilaterally.  Normal effort Cardio: S1-S2 is normal regular.  No S3-S4.  No rubs murmurs or bruit GI: Abdomen is soft.  Nontender nondistended.  Bowel sounds are present normal.  No masses organomegaly     DISPOSITION: Home  Discharge Instructions    Ambulatory referral to Neurology   Complete by: As directed    An appointment is requested in approximately: 4 weeks for stroke f/u   Call MD for:  difficulty breathing, headache or visual disturbances    Complete by: As directed    Call MD for:  extreme fatigue   Complete by: As directed    Call MD for:  persistant dizziness or light-headedness   Complete by: As directed    Call MD for:  persistant nausea and vomiting   Complete by: As directed    Call MD for:  severe uncontrolled pain   Complete by: As directed    Call MD for:  temperature >100.4   Complete by: As directed    Diet - low sodium heart healthy   Complete by: As directed    Discharge instructions   Complete by: As directed    Please take your medications as prescribed.  Follow-up with your primary care provider in 1 week.  Referral sent to neurology for follow-up in approximately 4 weeks.  You were cared for by a hospitalist during your hospital stay. If you have any questions about your discharge medications or the care you received while you were in the hospital after you are discharged, you can call the unit and asked to speak with the hospitalist on call if the hospitalist that took care of you is not available. Once you are discharged, your primary care physician will handle any further medical issues. Please note that NO REFILLS for any discharge medications will be authorized once you are discharged, as it is imperative that you return to your primary care physician (or establish a relationship with a primary care physician if you  do not have one) for your aftercare needs so that they can reassess your need for medications and monitor your lab values. If you do not have a primary care physician, you can call [phone] for a physician referral.   Increase activity slowly   Complete by: As directed         Allergies as of 03/11/2021      Reactions   Hydrocodone Nausea And Vomiting      Medication List    STOP taking these medications   aspirin 81 MG EC tablet     TAKE these medications   apixaban 5 MG Tabs tablet Commonly known as: ELIQUIS Take 1 tablet (5 mg total) by mouth 2 (two) times daily.    atorvastatin 20 MG tablet Commonly known as: LIPITOR Take 1 tablet (20 mg total) by mouth daily.   CENTRUM SILVER PO Take 1 tablet by mouth daily.   metoprolol succinate 25 MG 24 hr tablet Commonly known as: TOPROL-XL Take 1 tablet (25 mg total) by mouth daily.         Follow-up Information    Little, Caryn Bee, MD. Schedule an appointment as soon as possible for a visit in 1 week(s).   Specialty: Family Medicine Contact information: [address] [place] [address] [phone]               TOTAL DISCHARGE TIME: 35 minutes  Neva Ramaswamy Rito Ehrlich  [facility] Pager on [email or web address]  03/11/2021, 5:51 PM

## 2021-03-11 NOTE — Evaluation (Signed)
Occupational Therapy Evaluation Patient Details Name: Cody Whitney MRN: [id number] DOB: [birth year] Today's Date: 03/11/2021    History of Present Illness 70 y/o male presented to [facility] ED on 5/10 with complaints of word finding difficulty. CT head found acute or early subacute L MCA infarct. Transferred to [facility] MRI found subacute L MCA infarct and chronic R PCA infarct. PMH: Afib, HLD, HTN, nicotine dependence.   Clinical Impression   Patient admitted for the diagnosis above.  PTA he was independent with all care and mobility.  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  He presents with mild word finding difficulties, but was able to order breakfast and lunch this am.  Patient is presenting at his baseline for ADL and mobility.  No further OT needs identified.  Recommend home when medically stable.      Follow Up Recommendations  No OT follow up    Equipment Recommendations  None recommended by OT    Recommendations for Other Services       Precautions / Restrictions Precautions Precautions: None Restrictions Weight Bearing Restrictions: No      Mobility Bed Mobility Overal bed mobility: Independent               Patient Response: Cooperative  Transfers Overall transfer level: Independent                    Balance Overall balance assessment: No apparent balance deficits (not formally assessed)                               Standardized Balance Assessment Standardized Balance Assessment : Dynamic Gait Index   Dynamic Gait Index Level Surface: Normal Change in Gait Speed: Normal Gait with Horizontal Head Turns: Normal Gait with Vertical Head Turns: Normal Gait and Pivot Turn: Normal Step Over Obstacle: Normal Step Around Obstacles: Normal Steps: Normal Total Score: 24     ADL either performed or assessed with clinical judgement   ADL Overall ADL's : At baseline                                              Vision Baseline Vision/History: Wears glasses Wears Glasses: At all times Patient Visual Report: No change from baseline       Perception     Praxis      Pertinent Vitals/Pain Pain Assessment: No/denies pain     Hand Dominance Right   Extremity/Trunk Assessment Upper Extremity Assessment Upper Extremity Assessment: Overall WFL for tasks assessed   Lower Extremity Assessment Lower Extremity Assessment: Overall WFL for tasks assessed   Cervical / Trunk Assessment Cervical / Trunk Assessment: Normal   Communication Communication Communication: Expressive difficulties (mild word finding)   Cognition Arousal/Alertness: Awake/alert Behavior During Therapy: WFL for tasks assessed/performed Overall Cognitive Status: Within Functional Limits for tasks assessed                                                      Home Living Family/patient expects to be discharged to:: Private residence Living Arrangements: Alone Available Help at Discharge: Friend(s);Available PRN/intermittently;Family Type of Home: House Home Access: Stairs to enter [employer] of Steps:  2-3 Entrance Stairs-Rails: None Home Layout: One level     Bathroom Shower/Tub: Producer, television/film/video: Standard     Home Equipment: None          Prior Functioning/Environment Level of Independence: Independent        Comments: driving, retired        OT Problem List: Other (comment) (speech)      OT Treatment/Interventions:      OT Goals(Current goals can be found in the care plan section) Acute Rehab OT Goals Patient Stated Goal: to go home OT Goal Formulation: With patient Time For Goal Achievement: 03/11/21 Potential to Achieve Goals: Good  OT Frequency:     Barriers to D/C:  none noted          Co-evaluation              AM-PAC OT "6 Clicks" Daily Activity     Outcome Measure Help from another person eating meals?: None Help  from another person taking care of personal grooming?: None Help from another person toileting, which includes using toliet, bedpan, or urinal?: None Help from another person bathing (including washing, rinsing, drying)?: None Help from another person to put on and taking off regular upper body clothing?: None Help from another person to put on and taking off regular lower body clothing?: None 6 Click Score: 24   End of Session    Activity Tolerance: Patient tolerated treatment well Patient left: in bed  OT Visit Diagnosis: Other (comment) (speech difficulties)                Time: 3785-8850 OT Time Calculation (min): 16 min Charges:  OT General Charges $OT Visit: 1 Visit OT Evaluation $OT Eval Moderate Complexity: 1 Mod  03/11/2021  Rich, OTR/L  Acute Rehabilitation Services  Office:  [phone]   Suzanna Obey 03/11/2021, 9:42 AM

## 2021-03-16 ENCOUNTER — Other Ambulatory Visit: Payer: Self-pay | Admitting: Family Medicine

## 2021-03-16 DIAGNOSIS — N2889 Other specified disorders of kidney and ureter: Secondary | ICD-10-CM

## 2021-03-20 DIAGNOSIS — Z8673 Personal history of transient ischemic attack (TIA), and cerebral infarction without residual deficits: Secondary | ICD-10-CM | POA: Diagnosis not present

## 2021-03-20 DIAGNOSIS — Z09 Encounter for follow-up examination after completed treatment for conditions other than malignant neoplasm: Secondary | ICD-10-CM | POA: Diagnosis not present

## 2021-03-20 DIAGNOSIS — I4891 Unspecified atrial fibrillation: Secondary | ICD-10-CM | POA: Diagnosis not present

## 2021-03-20 DIAGNOSIS — K118 Other diseases of salivary glands: Secondary | ICD-10-CM | POA: Diagnosis not present

## 2021-03-26 ENCOUNTER — Ambulatory Visit
Admission: RE | Admit: 2021-03-26 | Discharge: 2021-03-26 | Disposition: A | Discharge status: Home / Self Care | Payer: Medicare Other | Source: Ambulatory Visit | Attending: Family Medicine | Admitting: Family Medicine

## 2021-03-26 DIAGNOSIS — N2889 Other specified disorders of kidney and ureter: Secondary | ICD-10-CM | POA: Diagnosis not present

## 2021-03-26 IMAGING — US US RENAL
1 series · 14 of 25 positions shown · non-contrast
Comparison: [DATE].

CLINICAL DATA: Left kidney nodule.

EXAM:
RENAL / URINARY TRACT ULTRASOUND COMPLETE

[Series 1: us renal · 0.25mm/px · 14 of 88 slices shown]
[im 1/88]
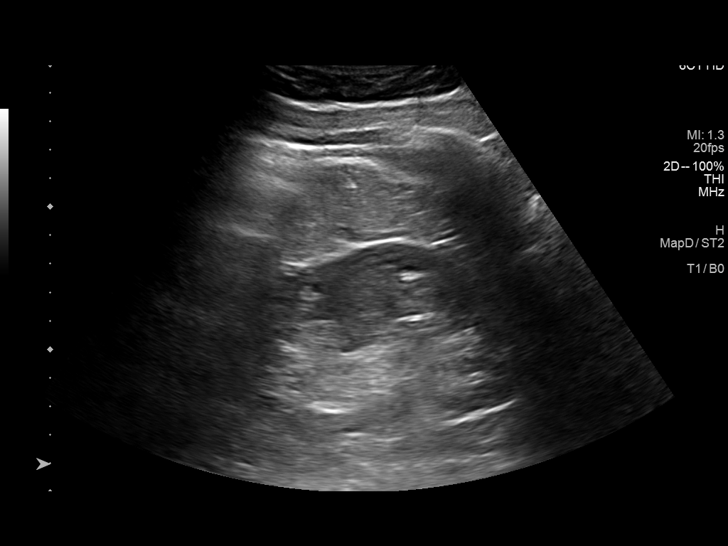
[im 8/88]
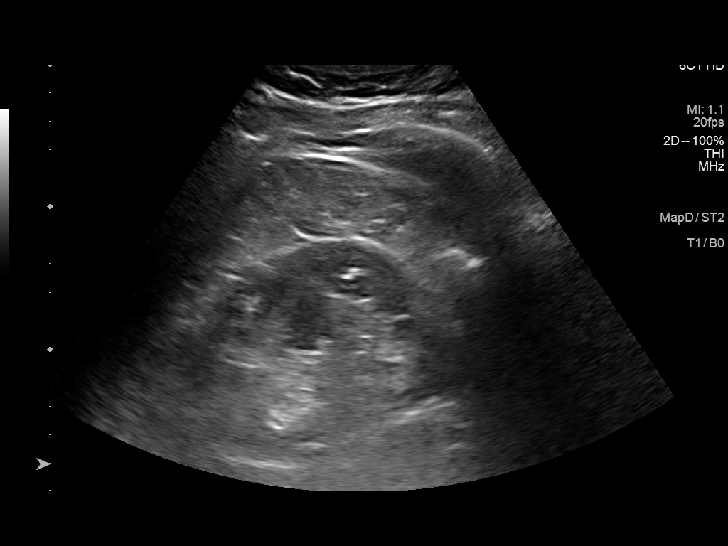
[im 15/88]
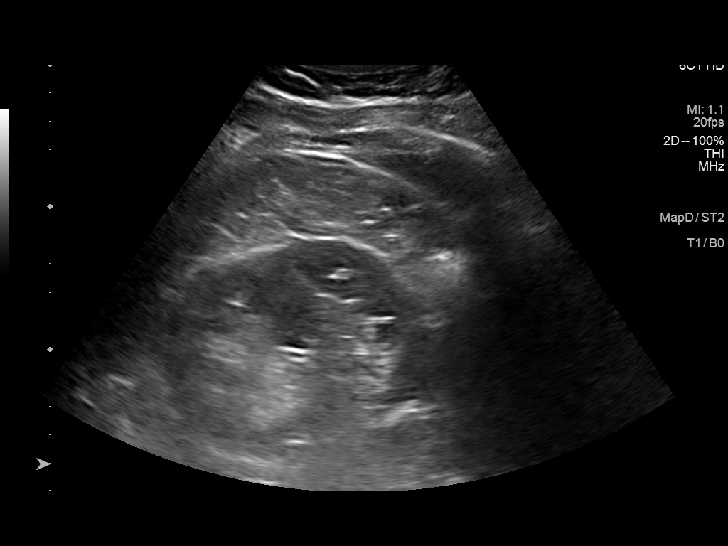
[im 22/88]
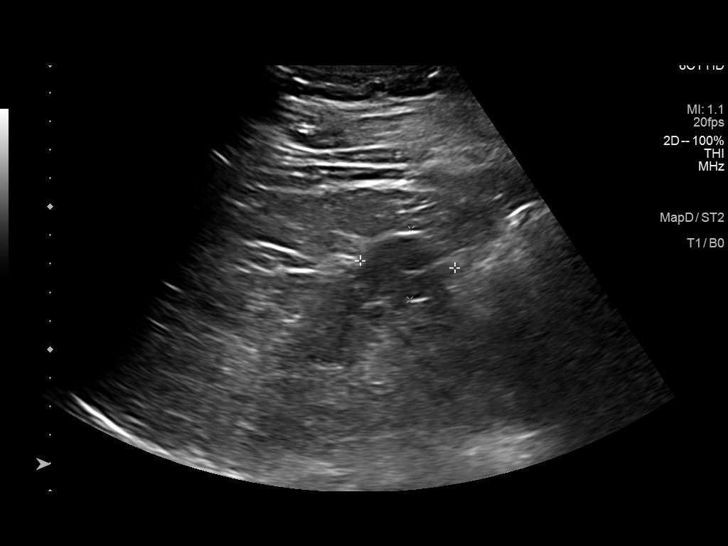
[im 30/88]
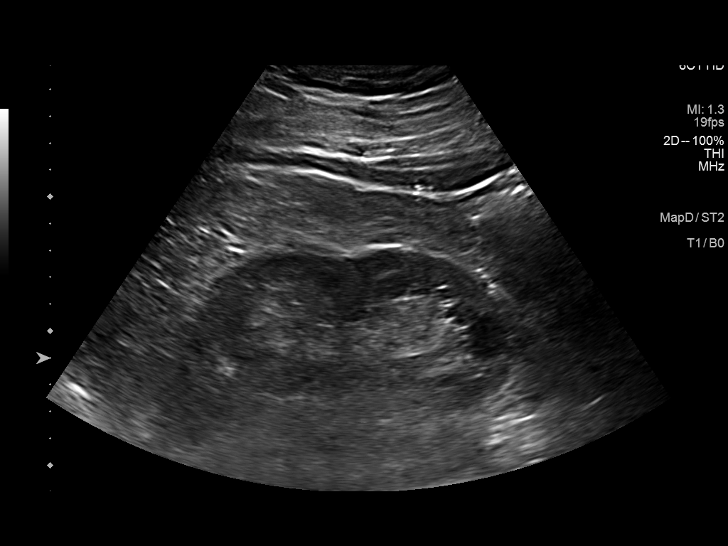
[im 33/88]
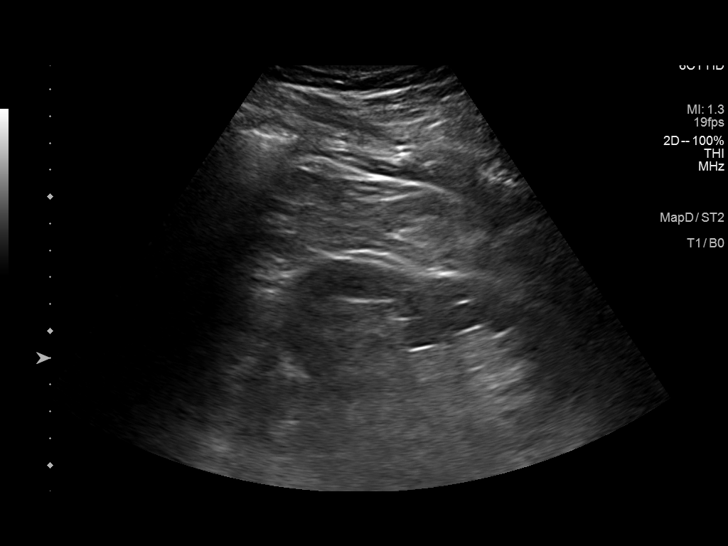
[im 40/88]
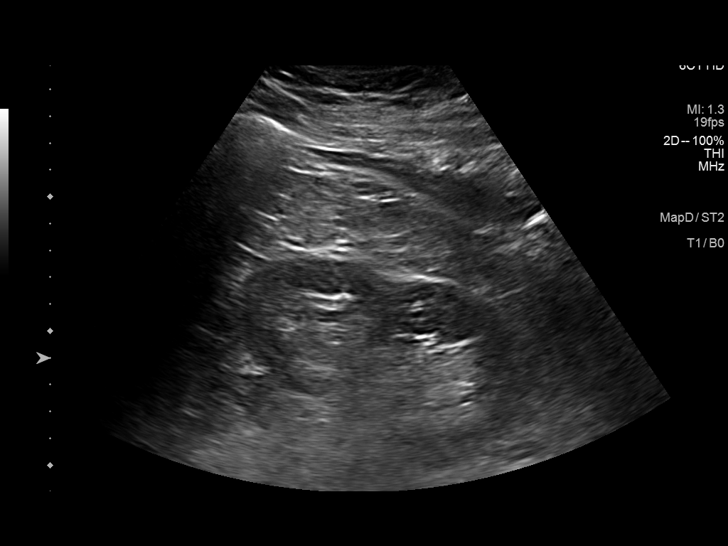
[im 48/88]
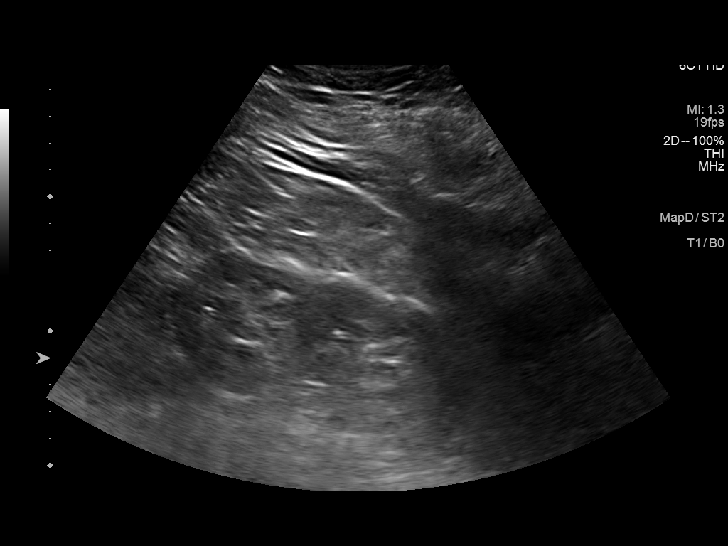
[im 55/88]
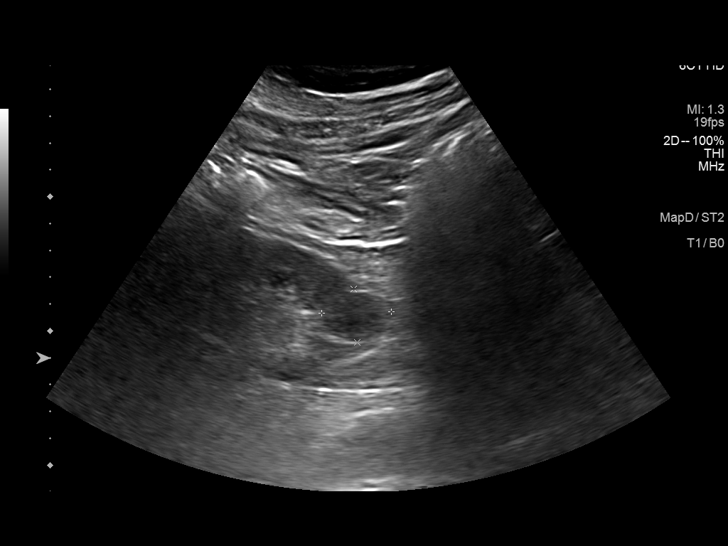
[im 59/88]
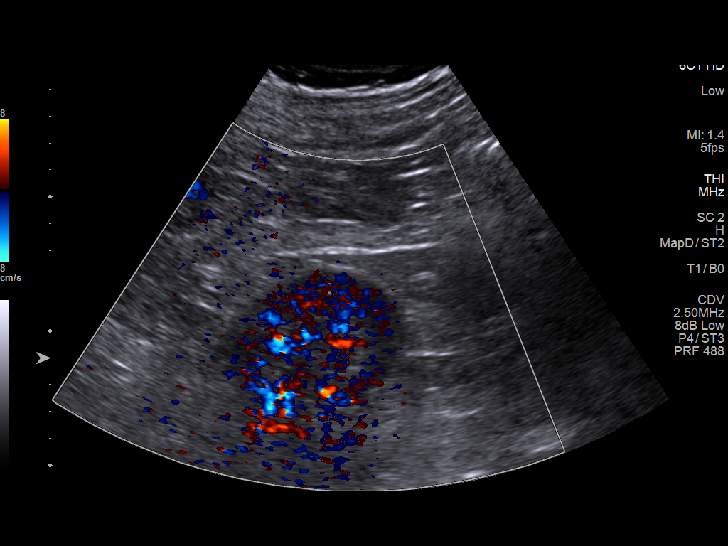
[im 66/88]
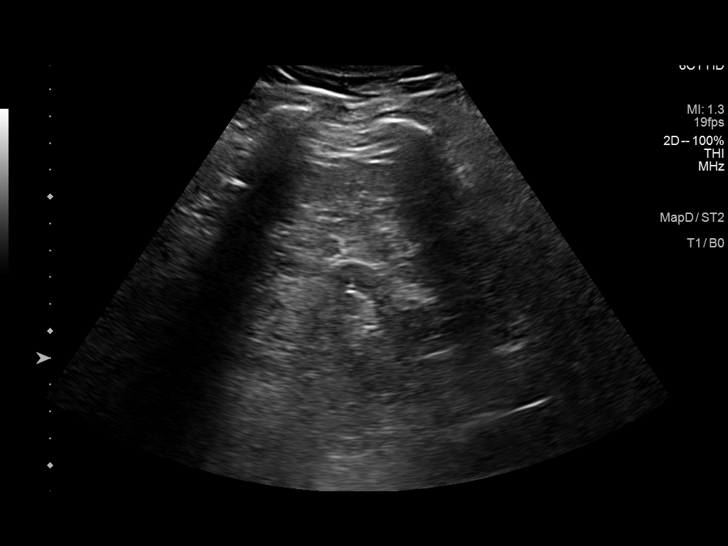
[im 73/88]
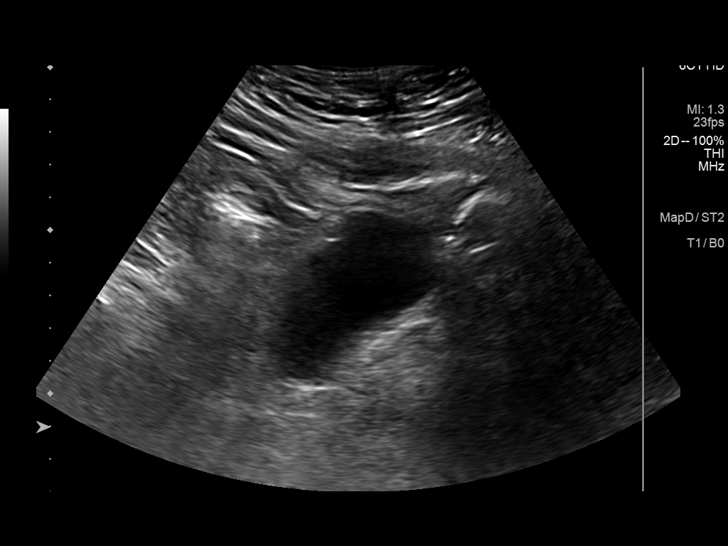
[im 80/88]
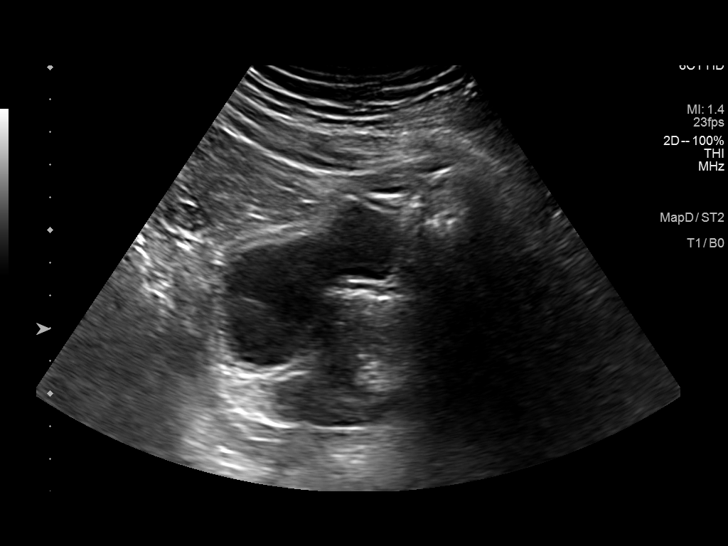
[im 88/88]
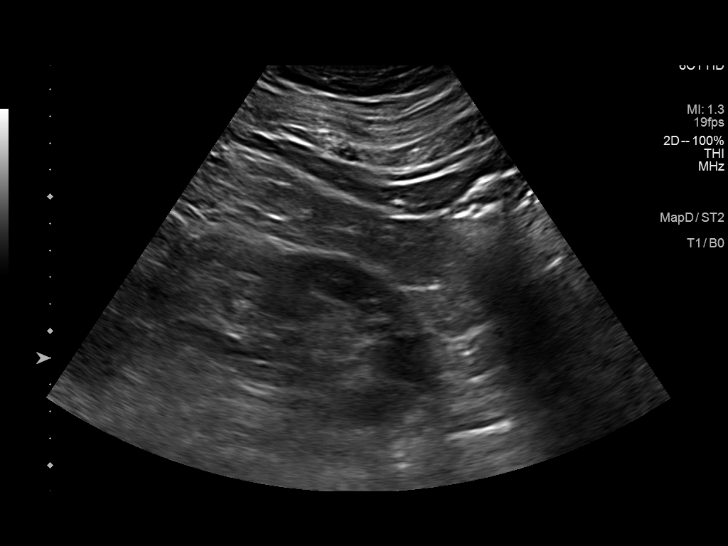

[14 of 25 positions shown; findings below may reference images not displayed]

FINDINGS: Right Kidney:

Renal measurements: 11.9 x 7.4 x 7.0 cm = volume: 321 mL.
Echogenicity within normal limits. No mass or hydronephrosis
visualized.

Left Kidney:

Renal measurements: 13.1 x 5.5 x 9.3 cm = volume: 346 mL.
Echogenicity within normal limits. No hydronephrosis visualized.
cm hypoechoic area which may represent normal tissue, but possible
mass or nodule cannot be excluded. Possible 2.6 cm complex cyst or
solid nodule seen in lower pole.

Bladder:

Appears normal for degree of bladder distention.

Other:

None.
IMPRESSION: 2.8 cm hypoechoic area involving midpole of left kidney which may
represent normal tissue and dromedary hump, but possible mass or
nodule cannot be excluded. Also noted is possible 2.6 cm complex
cyst or solid nodule seen in the lower pole of left kidney. Given
these findings, MRI of the kidneys is recommended for further
evaluation.

## 2021-04-02 ENCOUNTER — Encounter: Payer: Self-pay | Admitting: Cardiovascular Disease

## 2021-04-02 NOTE — Progress Notes (Signed)
Cardiology Office Note   Date:  04/03/2021   ID:  Cody Whitney, DOB [birth year], MRN [id number]  PCP:  Cody Pilon, FNP  Cardiologist:   Cody Miss, MD   Chief Complaint  Patient presents with  . Atrial Fibrillation  . Hyperlipidemia   [name] is a 70 yo gentleman with a hx of A-fib. He is a previous patient of Dr. Reyes Whitney and then Dr. Deborah Whitney. He walks regularly at work. He has no limitations doing his yardwork. 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 He's not had any episodes of chest pain or shortness breath.  Mar 29, 2013:  Cody Whitney is doing well. He is busy taking care of his wife who has cancer. He has not been exercising much because of his busy schedule  April 01, 2014:  Cody Whitney is doing ok.      Cody Whitney is a 70 y.o. male who presents for further eval of his atrial fib.  He is 70 years old and is on aspirin alone. He's on metoprolol for his rate control. He remains completely asymptomatic. He's able to do all of his normal activities without any significant problems.  May 15 ,2017:  Cody Whitney is doing well.   Has well controlled atrial fib  CHADS2VASC is 1 ( age)  No HTN Very active around the house- just retired several weeks ago   Mar 21, 2017:  Cody Whitney is doing well .  Seen for follow up of his atrial fib  Has joined [employer] .  Has chronic AF  - CHADS2VASC is 1 ( age)   Mar 22, 2018  Doing well. CHADs2VASC is 1 age - no hx of HTN  Works out some . limite by migraine headaches , is on Imitrex which helps   Mar 27, 2020 Cody Whitney is seen today for follow up of his atrial fib CHADS2VASC is 1 ( age)  Did a telemedicine visit last year.   Walking some .   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  Has had both covid vaccines.  Lipids are mildly elevated.   Does not want to start a  Statin  April 03, 2021: Cody Whitney is seen today for follow up of his atrial  fib. He has borderline HTN He has finally agreed to take Eliquis after having a small stroke .  He has recovered nicely   CHADS2VASC is now 4 ( HTN, age, CVA  )  His readings at home are around 130/80  Is a previous smoker Has had low dose CT scans of his lungs Over all is doing well      Past Medical History:  Diagnosis Date  . Atrial fibrillation (HCC) 11/10/2011  . Headache     Past Surgical History:  Procedure Laterality Date  . FACIAL FRACTURE SURGERY    . TONSILLECTOMY       Current Outpatient Medications  Medication Sig Dispense Refill  . apixaban (ELIQUIS) 5 MG TABS tablet Take 1 tablet (5 mg total) by mouth 2 (two) times daily. 60 tablet 1  . atorvastatin (LIPITOR) 20 MG tablet Take 1 tablet (20 mg total) by mouth daily. 30 tablet 1  . Multiple Vitamins-Minerals (CENTRUM SILVER PO) Take 1 tablet by mouth daily.     . metoprolol succinate (TOPROL-XL) 25 MG 24 hr tablet Take 1 tablet (25 mg total) by mouth daily. 90 tablet 3   No current facility-administered medications for  this visit.    Allergies:   Hydrocodone    Social History:  The patient  reports that he has been smoking cigarettes. He has been smoking about 0.50 packs per day. He has never used smokeless tobacco. He reports that he does not drink alcohol and does not use drugs.   Family History:  The patient's family history includes Atrial fibrillation in his sister; Migraines in his sister. He was adopted.    ROS:  Please see the history of present illness.    Physical Exam: Blood pressure (!) 142/92, pulse 65, height 5\' 11"  (1.803 m), weight 224 lb 6.4 oz (101.8 kg), SpO2 98 %.  GEN:  Well nourished, well developed in no acute distress HEENT: Normal NECK: No JVD; No carotid bruits LYMPHATICS: No lymphadenopathy CARDIAC: irreg. Irreg.  RESPIRATORY:  Clear to auscultation without rales, wheezing or rhonchi  ABDOMEN: Soft, non-tender, non-distended MUSCULOSKELETAL:  No edema; No deformity  SKIN:  Warm and dry NEUROLOGIC:  Alert and oriented x 3    EKG:      Recent Labs: 03/10/2021: ALT 19; BUN 15; Creatinine, Ser 1.09; Hemoglobin 14.8; Platelets 306; Potassium 3.7; Sodium 139    Lipid Panel    Component Value Date/Time   CHOL 168 03/10/2021 2310   TRIG 78 03/10/2021 2310   HDL 36 (L) 03/10/2021 2310   CHOLHDL 4.7 03/10/2021 2310   VLDL 16 03/10/2021 2310   LDLCALC 116 (H) 03/10/2021 2310   LDLDIRECT 159.9 11/05/2011 0856      Wt Readings from Last 3 Encounters:  04/03/21 224 lb 6.4 oz (101.8 kg)  03/10/21 220 lb (99.8 kg)  11/05/20 228 lb (103.4 kg)      Other studies Reviewed: Additional studies/ records that were reviewed today include: . Review of the above records demonstrates:    ASSESSMENT AND PLAN:  1.  Atrial Fibrillation:    This patients CHA2DS2-VASc Score is 4 ( age, borderline HTN, CVA )  He has finally agreed to take Eliquis now that he had a small stroke.  He seems to be improving well.  2.  Borderline hypertension:  Ive asked him to recored his BP in a note pad and bring with him to his future visits   3.  Hyperlipidemia: - per primary MD       Current medicines are reviewed at length with the patient today.  The patient does not have concerns regarding medicines.  The following changes have been made:  no change  Labs/ tests ordered today include:   No orders of the defined types were placed in this encounter.    Disposition:  Will have him follow up with Dr. 01/03/21 in 1 year.      Shari Prows, MD  04/03/2021 10:42 AM    [facility] [address] [place], [place], [place]  [place] Phone: [phone]; Fax: [phone]

## 2021-04-03 ENCOUNTER — Other Ambulatory Visit: Payer: Self-pay

## 2021-04-03 ENCOUNTER — Encounter: Payer: Self-pay | Admitting: Cardiovascular Disease

## 2021-04-03 ENCOUNTER — Ambulatory Visit: Payer: Medicare Other | Admitting: Cardiovascular Disease

## 2021-04-03 VITALS — BP 142/92 | HR 65 | Ht 71.0 in | Wt 224.4 lb

## 2021-04-03 DIAGNOSIS — I482 Chronic atrial fibrillation, unspecified: Secondary | ICD-10-CM | POA: Diagnosis not present

## 2021-04-03 DIAGNOSIS — I639 Cerebral infarction, unspecified: Secondary | ICD-10-CM

## 2021-04-03 DIAGNOSIS — I4819 Other persistent atrial fibrillation: Secondary | ICD-10-CM | POA: Diagnosis not present

## 2021-04-03 MED ORDER — METOPROLOL SUCCINATE ER 25 MG PO TB24: 25.0000 mg | ORAL_TABLET | Freq: Every day | ORAL | 3 refills | Status: DC

## 2021-04-03 NOTE — Patient Instructions (Signed)
Medication Instructions:  Your physician recommends that you continue on your current medications as directed. Please refer to the Current Medication list given to you today.  *If you need a refill on your cardiac medications before your next appointment, please call your pharmacy*   Lab Work: None If you have labs (blood work) drawn today and your tests are completely normal, you will receive your results only by: . MyChart Message (if you have MyChart) OR . A paper copy in the mail If you have any lab test that is abnormal or we need to change your treatment, we will call you to review the results.   Testing/Procedures: None   Follow-Up: At [facility], you and your health needs are our priority.  As part of our continuing mission to provide you with exceptional heart care, we have created designated Provider Care Teams.  These Care Teams include your primary Cardiologist (physician) and Advanced Practice Providers (APPs -  Physician Assistants and Nurse Practitioners) who all work together to provide you with the care you need, when you need it.  We recommend signing up for the patient portal called "MyChart".  Sign up information is provided on this After Visit Summary.  MyChart is used to connect with patients for Virtual Visits (Telemedicine).  Patients are able to view lab/test results, encounter notes, upcoming appointments, etc.  Non-urgent messages can be sent to your provider as well.   To learn more about what you can do with MyChart, go to [email or web address].    Your next appointment:   1 year(s)  The format for your next appointment:   In Person  Provider:   You may see Heather Pemberton, MD or one of the following Advanced Practice Providers on your designated Care Team:    Scott Weaver, PA-C  Vin Bhagat, PA-C    Other Instructions   

## 2021-04-06 NOTE — Addendum Note (Signed)
Addended by: Cleda Mccreedy on: 04/06/2021 01:58 PM   Modules accepted: Orders

## 2021-04-09 ENCOUNTER — Other Ambulatory Visit: Payer: Self-pay | Admitting: Family Medicine

## 2021-04-10 ENCOUNTER — Other Ambulatory Visit: Payer: Self-pay | Admitting: Family Medicine

## 2021-04-10 DIAGNOSIS — N2889 Other specified disorders of kidney and ureter: Secondary | ICD-10-CM

## 2021-04-29 ENCOUNTER — Ambulatory Visit: Payer: Medicare Other | Admitting: Adult Health

## 2021-04-29 ENCOUNTER — Encounter: Payer: Self-pay | Admitting: Adult Health

## 2021-04-29 VITALS — BP 134/86 | HR 75 | Ht 72.0 in | Wt 224.0 lb

## 2021-04-29 DIAGNOSIS — I482 Chronic atrial fibrillation, unspecified: Secondary | ICD-10-CM

## 2021-04-29 DIAGNOSIS — I63412 Cerebral infarction due to embolism of left middle cerebral artery: Secondary | ICD-10-CM

## 2021-04-29 DIAGNOSIS — E782 Mixed hyperlipidemia: Secondary | ICD-10-CM | POA: Diagnosis not present

## 2021-04-29 DIAGNOSIS — I10 Essential (primary) hypertension: Secondary | ICD-10-CM | POA: Diagnosis not present

## 2021-04-29 NOTE — Progress Notes (Signed)
[facility] [address] [place]. [place] [address] [phone]       HOSPITAL FOLLOW UP NOTE  [name] Date of Birth:  02/06/1951 Medical Record Number:  [id number]   Reason for Referral:  hospital stroke follow up    SUBJECTIVE:   CHIEF COMPLAINT:  Chief Complaint  Patient presents with   Follow-up    TR alone Pt is well and stable, no complaints    HPI:   Cody Whitney is a 70 yo male with PMHx of A-fib, HLD, HTN, migraines who presented  from [facility] to [facility] ED on 03/10/2021 with word finding difficulties since 5/9.  Personally reviewed hospitalization pertinent progress notes, lab work and imaging with summary provided.  Evaluated by Dr. Pearlean Brownie with stroke work-up revealing subacute left MCA distribution infarct involving the left insula and overlying left frontal operculum likely due to atrial fibrillation not on anticoagulation.  CTA head/neck negative.  2D echo EF 55 to 60% with left atrial size mildly dilated.  LDL 116.  A1c 6.1.  Recommend initiating Eliquis 5 mg twice daily for secondary stroke prevention and history of A. fib as well as initiate atorvastatin 20 mg daily.  He was not previously on anticoagulation given low CHA2DS2-VASc of 1.  Other stroke risk factors include advanced age, prior tobacco use and prior history of stroke right PCA distribution infarct seen on imaging.  Today, 04/29/2021, Cody Whitney is being seen for hospital follow-up unaccompanied.  He has been doing very well since discharge without any new or reoccurring stroke/TIA symptoms.  He has remained on Eliquis as well as atorvastatin without any associated side effects.  Blood pressure today 134/86.  He routinely monitors at home and typically stable.  He has since been seen by PCP and cardiology.  No concerns at this time.     ROS:   14 system review of systems performed and negative with exception of no complaints  PMH:  Past Medical History:   Diagnosis Date   Atrial fibrillation (HCC) 11/10/2011   Headache     PSH:  Past Surgical History:  Procedure Laterality Date   FACIAL FRACTURE SURGERY     TONSILLECTOMY      Social History:  Social History   Socioeconomic History   Marital status: Widowed    Spouse name: Not on file   Number of children: 1   Years of education: Masters   Highest education level: Not on file  Occupational History   Occupation: Retired  Tobacco Use   Smoking status: Every Day    Packs/day: 0.50    Pack years: 0.00    Types: Cigarettes   Smokeless tobacco: Never  Vaping Use   Vaping Use: Never used  Substance and Sexual Activity   Alcohol use: No    Comment: Last used 1980   Drug use: No   Sexual activity: Not on file  Other Topics Concern   Not on file  Social History Narrative   Lives at home alone.   Left-handed.   2-3 cups caffeine daily.   Social Determinants of Health   Financial Resource Strain: Not on file  Food Insecurity: Not on file  Transportation Needs: Not on file  Physical Activity: Not on file  Stress: Not on file  Social Connections: Not on file  Intimate Partner Violence: Not on file    Family History:  Family History  Adopted: Yes  Problem Relation Age of Onset   Atrial fibrillation Sister  Migraines Sister     Medications:   Current Outpatient Medications on File Prior to Visit  Medication Sig Dispense Refill   apixaban (ELIQUIS) 5 MG TABS tablet Take 1 tablet (5 mg total) by mouth 2 (two) times daily. 60 tablet 1   atorvastatin (LIPITOR) 20 MG tablet Take 1 tablet (20 mg total) by mouth daily. 30 tablet 1   metoprolol succinate (TOPROL-XL) 25 MG 24 hr tablet Take 1 tablet (25 mg total) by mouth daily. 90 tablet 3   Multiple Vitamins-Minerals (CENTRUM SILVER PO) Take 1 tablet by mouth daily.      No current facility-administered medications on file prior to visit.    Allergies:   Allergies  Allergen Reactions   Hydrocodone Nausea And  Vomiting      OBJECTIVE:  Physical Exam  Vitals:   04/29/21 0957  BP: 134/86  Pulse: 75  Weight: 224 lb (101.6 kg)  Height: 6' (1.829 m)   Body mass index is 30.38 kg/m. No results found.  Post stroke PHQ 2/9 Depression screen PHQ 2/9 04/29/2021  Decreased Interest 0  Down, Depressed, Hopeless 0  PHQ - 2 Score 0     General: well developed, well nourished, very pleasant elderly Caucasian male, seated, in no evident distress Head: head normocephalic and atraumatic.   Neck: supple with no carotid or supraclavicular bruits Cardiovascular: irregular rate and rhythm, no murmurs Musculoskeletal: no deformity Skin:  no rash/petichiae Vascular:  Normal pulses all extremities   Neurologic Exam Mental Status: Awake and fully alert.  Fluent speech and language.  Oriented to place and time. Recent and remote memory intact. Attention span, concentration and fund of knowledge appropriate. Mood and affect appropriate.  Cranial Nerves: Fundoscopic exam reveals sharp disc margins. Pupils equal, briskly reactive to light. Extraocular movements full without nystagmus. Visual fields full to confrontation. Hearing intact. Facial sensation intact. Face, tongue, palate moves normally and symmetrically.  Motor: Normal bulk and tone. Normal strength in all tested extremity muscles Sensory.: intact to touch , pinprick , position and vibratory sensation.  Coordination: Rapid alternating movements normal in all extremities. Finger-to-nose and heel-to-shin performed accurately bilaterally. Gait and Station: Arises from chair without difficulty. Stance is normal. Gait demonstrates normal stride length and balance without use of assistive device. Tandem walk and heel toe without difficulty.  Reflexes: 1+ and symmetric. Toes downgoing.     NIHSS  0 Modified Rankin  0      ASSESSMENT: Cody Whitney is a 70 y.o. year old male with recent subacute left MCA distribution infarct involving the left  insula and overlying left frontal operculum likely due to A. fib not on AC on 03/10/2021. Vascular risk factors include A. fib, HTN, HLD, migraines, former tobacco use, and prior history of stroke (chronic R PCA on imaging).      PLAN:  Embolic left MCA stroke:  Recovered well without residual deficit.   Continue Eliquis (apixaban) daily  and atorvastatin 20 mg daily for secondary stroke prevention -he plans on contacting his PCP to obtain additional refills -he was advised that if he has difficulty contacting his PCP for refills to let me know as it will be important that he does not run out of Eliquis Discussed secondary stroke prevention measures and importance of close PCP follow up for aggressive stroke risk factor management  Atrial fibrillation: On Eliquis 5 mg twice daily for CHA2DS2-VASc score of at least 4 (age, HTN, hx stroke) routinely followed by cardiologist Dr. Elease Hashimoto HTN: BP goal <130/90.  Stable on current regimen per PCP/cardiology HLD: LDL goal <70. Recent LDL 116 -started on atorvastatin 40 mg daily.  Request ongoing prescribing and monitoring by PCP/cardiology    Follow up in 6 months or call earlier if needed   CC:  [facility] provider: Dr. Pearlean Brownie PCP: Soundra Pilon, FNP    I spent 42 minutes of face-to-face and non-face-to-face time with patient.  This included previsit chart review including review of recent hospitalization, lab review, study review, electronic health record documentation, and patient education and discussion regarding recent stroke including etiology, secondary stroke prevention and aggressive stroke risk factor management and answered all questions to patient satisfaction   Ihor Austin, AGNP-BC  [facility] [address] [place], [place] [address]  Phone [phone] Fax [phone] Note: This document was prepared with digital dictation and possible smart phrase technology. Any transcriptional errors that  result from this process are unintentional.

## 2021-05-01 NOTE — Progress Notes (Signed)
I agree with the above plan 

## 2021-05-09 ENCOUNTER — Other Ambulatory Visit: Payer: Self-pay

## 2021-05-09 ENCOUNTER — Ambulatory Visit
Admission: RE | Admit: 2021-05-09 | Discharge: 2021-05-09 | Disposition: A | Discharge status: Home / Self Care | Payer: Medicare Other | Source: Ambulatory Visit | Attending: Family Medicine | Admitting: Family Medicine

## 2021-05-09 DIAGNOSIS — N281 Cyst of kidney, acquired: Secondary | ICD-10-CM | POA: Diagnosis not present

## 2021-05-09 DIAGNOSIS — N2889 Other specified disorders of kidney and ureter: Secondary | ICD-10-CM

## 2021-05-09 IMAGING — MR MR ABDOMEN WO/W CM
12 of 18 series · 32 of 48 positions shown · IV contrast (20ml Multihance)
Comparison: Abdominal ultrasound, [DATE], CT chest lung cancer
screening, [DATE]

CLINICAL DATA: Evaluate left renal lesion

EXAM:
MRI ABDOMEN WITHOUT AND WITH CONTRAST
TECHNIQUE: Multiplanar multisequence MR imaging of the abdomen was performed
both before and after the administration of intravenous contrast.
CONTRAST:  20mL MULTIHANCE GADOBENATE DIMEGLUMINE 529 MG/ML IV SOLN

[Series 3: T2 · coronal · 5.0mm · 1.64mm/px · 1 of 18 slices shown (1 of 3)]
[im 1/18]
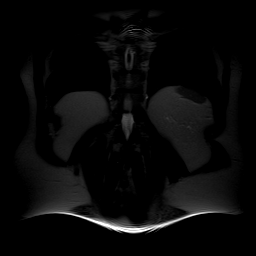

[Series 4: axial tru fisp · axial · 4.0mm · 1.56mm/px · 1 of 42 slices shown]
[im 1/42]
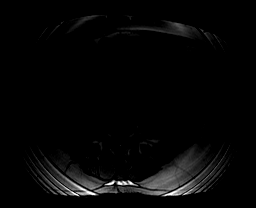

[Series 5: T2 · axial · 5.0mm · 0.78mm/px · 1 of 39 slices shown (2 of 3)]
[im 1/39]
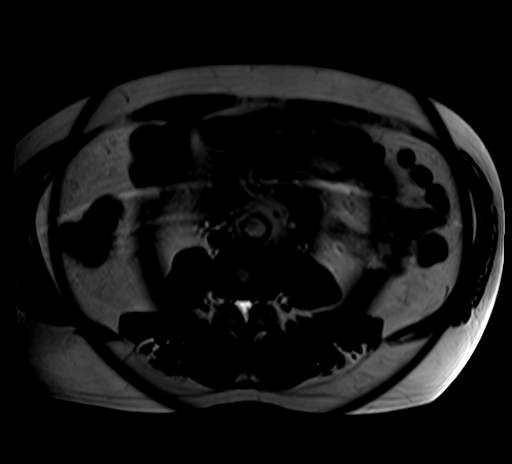

[Series 6: ep2d_diff_b50_500_800_p2 · axial · 5.0mm · 2.08mm/px · z∈[-99,+129]mm · 4 of 117 slices shown]
[im 1/117]
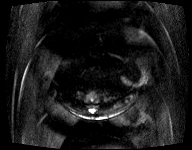
[im 39/117]
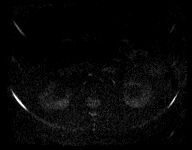
[im 78/117]
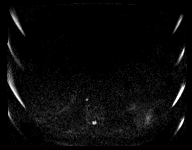
[im 117/117]
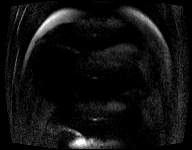

[Series 7: ep2d_diff_b50_500_800_p2_adc · axial · 5.0mm · 2.08mm/px · z∈[-99,+129]mm · 2 of 39 slices shown]
[im 1/39]
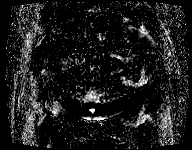
[im 39/39]
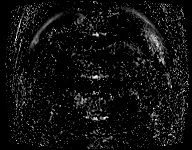

[Series 8: T2 · axial · 5.0mm · 1.56mm/px · 1 of 38 slices shown (3 of 3)]
[im 1/38]
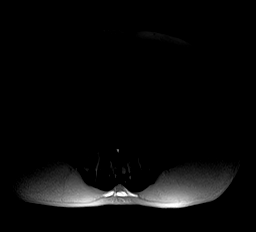

[Series 9: axial in out · axial · 5.5mm · 0.78mm/px · z∈[-128,+97]mm · 3 of 70 slices shown]
[im 1/70]
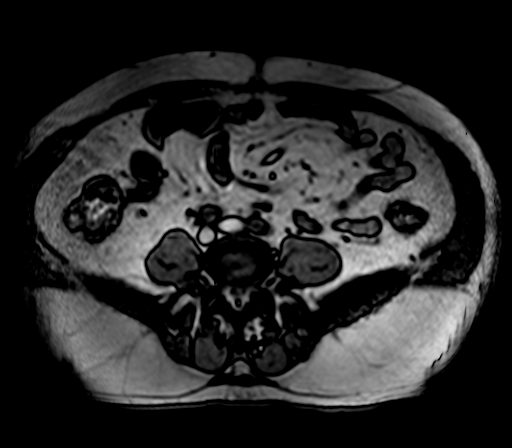
[im 35/70]
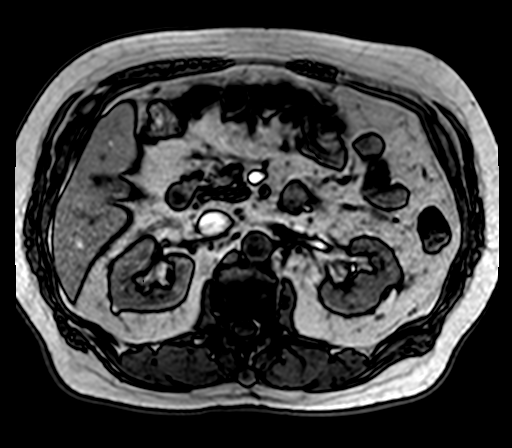
[im 70/70]
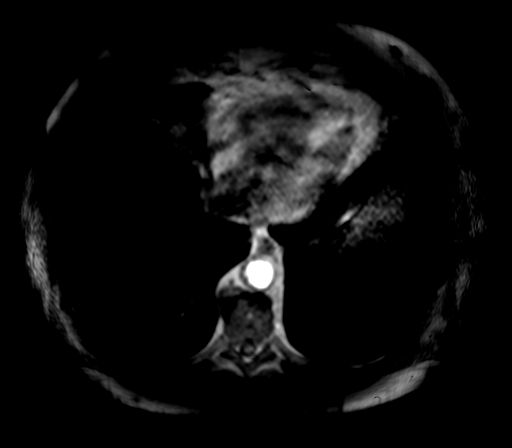

[Series 10: T1 dynamic · axial · non-contrast · 2.0mm · 0.78mm/px · z∈[-120,+86]mm · 4 of 104 slices shown]
[im 1/104]
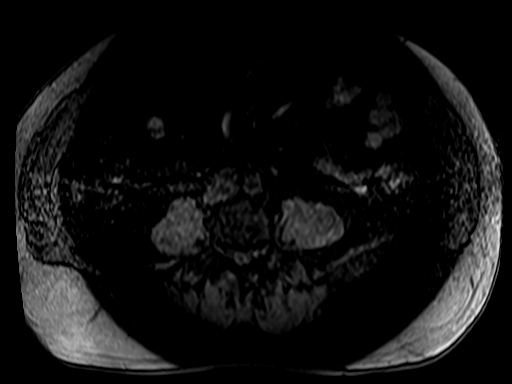
[im 35/104]
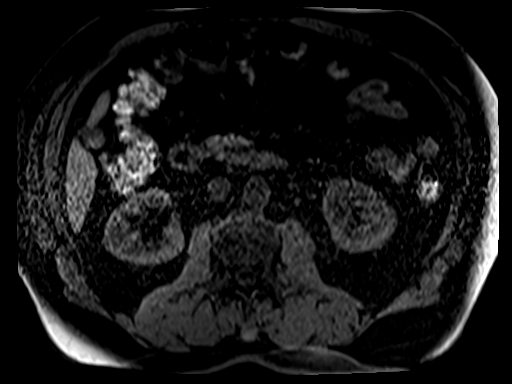
[im 69/104]
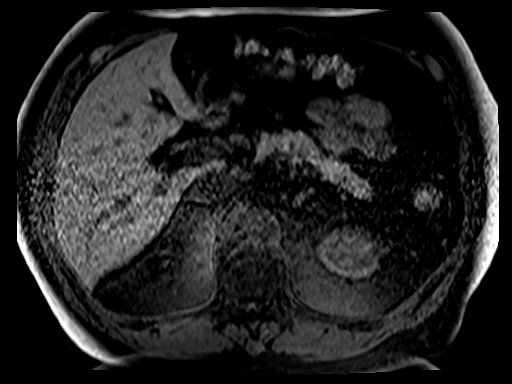
[im 104/104]
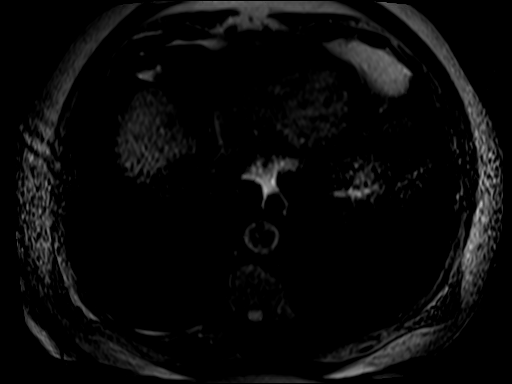

[Series 11: post 25 sec · axial · 2.0mm · 0.78mm/px · z∈[-120,+86]mm · 4 of 104 slices shown]
[im 1/104]
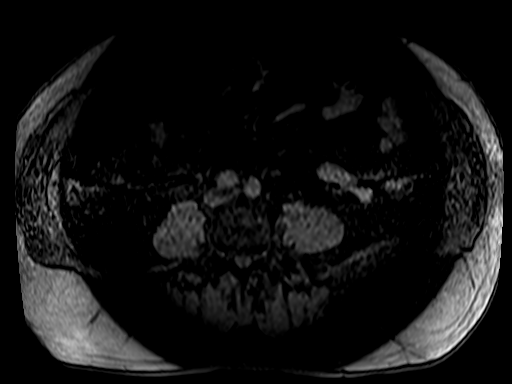
[im 35/104]
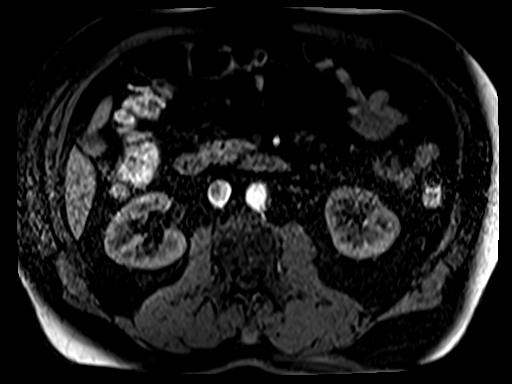
[im 69/104]
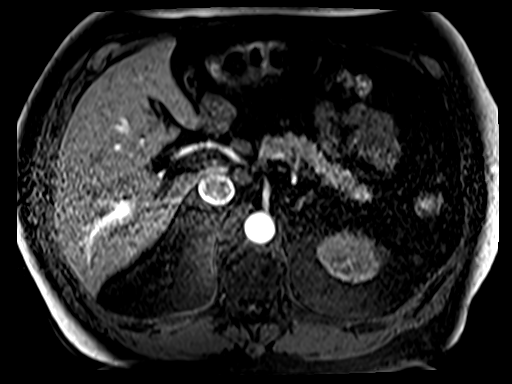
[im 104/104]
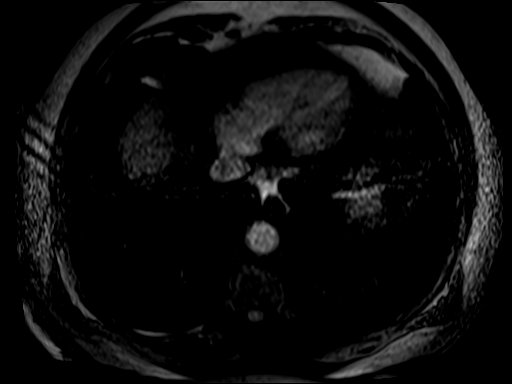

[Series 12: post 25 sec_sub · axial · 2.0mm · 0.78mm/px · z∈[-120,+86]mm · 4 of 104 slices shown]
[im 1/104]
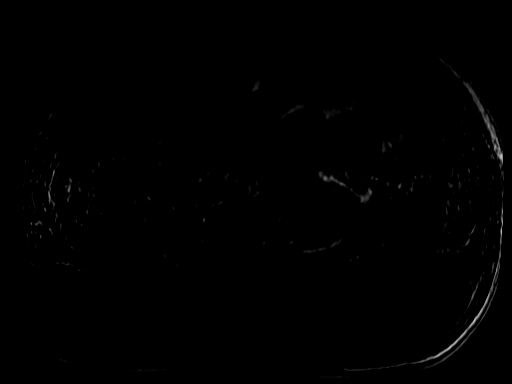
[im 35/104]
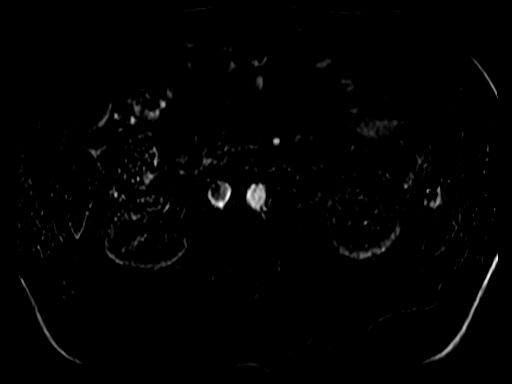
[im 69/104]
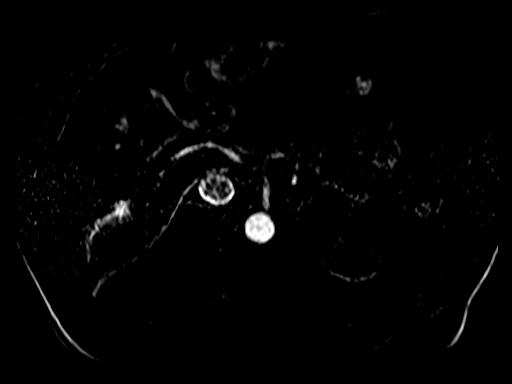
[im 104/104]
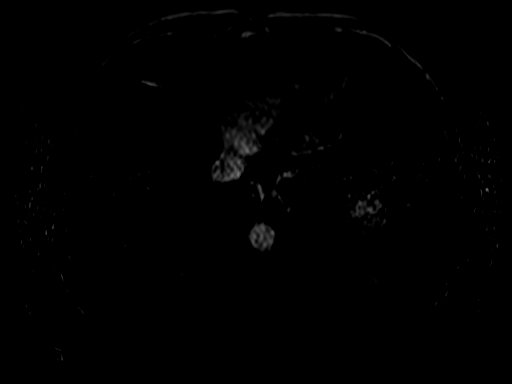

[Series 13: post 45 sec · axial · 2.0mm · 0.78mm/px · z∈[-120,+86]mm · 4 of 104 slices shown]
[im 1/104]
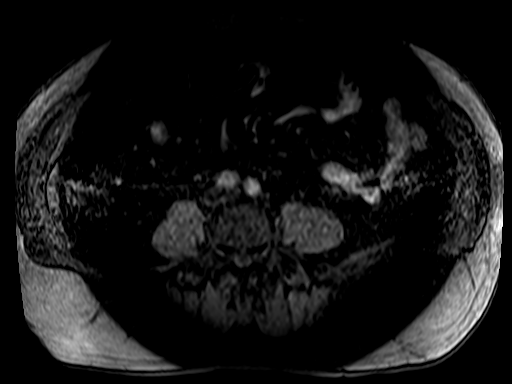
[im 35/104]
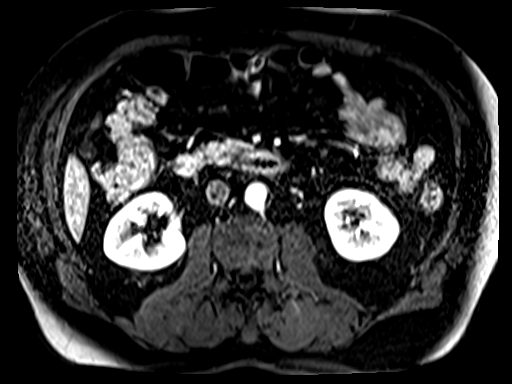
[im 69/104]
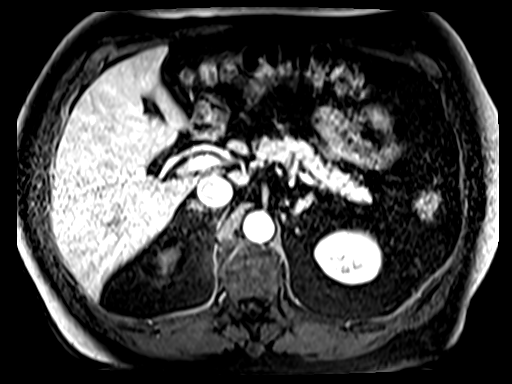
[im 104/104]
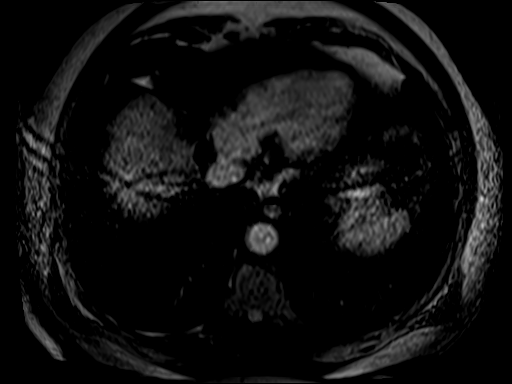

[Series 14: post 45 sec_sub · axial · 2.0mm · 0.78mm/px · z∈[-120,+16]mm · 3 of 104 slices shown]
[im 1/104]
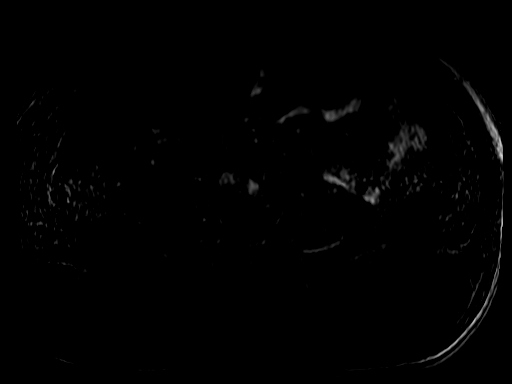
[im 35/104]
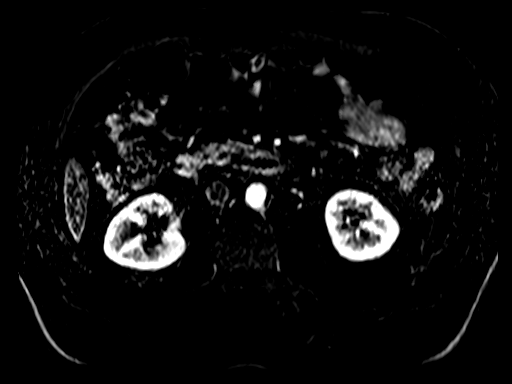
[im 69/104]
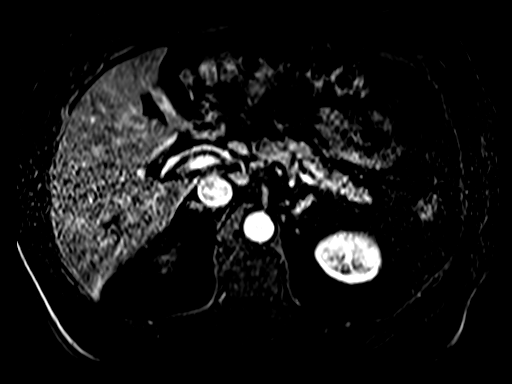

[32 of 48 positions shown; findings below may reference images not displayed]

FINDINGS: Lower chest: No acute findings.

Hepatobiliary: No mass or other parenchymal abnormality identified.

Pancreas: No mass, inflammatory changes, or other parenchymal
abnormality identified.

Spleen:  Within normal limits in size and appearance.

Adrenals/Urinary Tract: No masses identified. Cortical scarring of
the lateral midportion of the left kidney. Fluid signal,
nonenhancing benign simple cyst of the inferior pole of the left
kidney. Additional subcentimeter fluid signal cysts bilaterally. No
evidence of hydronephrosis.

Stomach/Bowel: Visualized portions within the abdomen are
unremarkable.

Vascular/Lymphatic: No pathologically enlarged lymph nodes
identified. No abdominal aortic aneurysm demonstrated.

Other:  None.

Musculoskeletal: No suspicious bone lesions identified.
IMPRESSION: 1. Cortical scarring of the lateral midportion of the left kidney.
No suspicious renal mass.

2.  Benign, simple cyst of the inferior pole of the left kidney.

## 2021-05-09 MED ORDER — GADOBENATE DIMEGLUMINE 529 MG/ML IV SOLN
20.0000 mL | Freq: Once | INTRAVENOUS | Status: AC | PRN
  Administered 2021-05-09: 20 mL via INTRAVENOUS

## 2021-09-09 DIAGNOSIS — I700 Atherosclerosis of aorta: Secondary | ICD-10-CM | POA: Diagnosis not present

## 2021-09-09 DIAGNOSIS — I4891 Unspecified atrial fibrillation: Secondary | ICD-10-CM | POA: Diagnosis not present

## 2021-09-09 DIAGNOSIS — E782 Mixed hyperlipidemia: Secondary | ICD-10-CM | POA: Diagnosis not present

## 2021-09-15 DIAGNOSIS — J439 Emphysema, unspecified: Secondary | ICD-10-CM | POA: Diagnosis not present

## 2021-09-15 DIAGNOSIS — Z23 Encounter for immunization: Secondary | ICD-10-CM | POA: Diagnosis not present

## 2021-09-15 DIAGNOSIS — Z0000 Encounter for general adult medical examination without abnormal findings: Secondary | ICD-10-CM | POA: Diagnosis not present

## 2021-09-15 DIAGNOSIS — R7303 Prediabetes: Secondary | ICD-10-CM | POA: Diagnosis not present

## 2021-09-15 DIAGNOSIS — I700 Atherosclerosis of aorta: Secondary | ICD-10-CM | POA: Diagnosis not present

## 2021-09-15 DIAGNOSIS — I4819 Other persistent atrial fibrillation: Secondary | ICD-10-CM | POA: Diagnosis not present

## 2021-09-15 DIAGNOSIS — E782 Mixed hyperlipidemia: Secondary | ICD-10-CM | POA: Diagnosis not present

## 2021-09-15 DIAGNOSIS — D6869 Other thrombophilia: Secondary | ICD-10-CM | POA: Diagnosis not present

## 2021-11-12 ENCOUNTER — Ambulatory Visit: Payer: Medicare Other | Admitting: Adult Health

## 2021-11-12 ENCOUNTER — Other Ambulatory Visit: Payer: Self-pay

## 2021-11-12 ENCOUNTER — Encounter: Payer: Self-pay | Admitting: Adult Health

## 2021-11-12 VITALS — BP 124/67 | HR 73 | Ht 72.0 in | Wt 221.4 lb

## 2021-11-12 DIAGNOSIS — E782 Mixed hyperlipidemia: Secondary | ICD-10-CM | POA: Diagnosis not present

## 2021-11-12 DIAGNOSIS — I10 Essential (primary) hypertension: Secondary | ICD-10-CM

## 2021-11-12 DIAGNOSIS — I63412 Cerebral infarction due to embolism of left middle cerebral artery: Secondary | ICD-10-CM

## 2021-11-12 DIAGNOSIS — I482 Chronic atrial fibrillation, unspecified: Secondary | ICD-10-CM | POA: Diagnosis not present

## 2021-11-12 NOTE — Progress Notes (Signed)
[facility] [address] [place] [address] [phone]       STROKE FOLLOW UP NOTE  [name] Date of Birth:  10/02/1951 Medical Record Number:  [id number]   Reason for Referral: stroke follow up    SUBJECTIVE:   CHIEF COMPLAINT:  Chief Complaint  Patient presents with   Cerebrovascular Accident    Rm 2 FU  "still have trouble with word finding"    HPI:   Update, 11/11/2021, JM: Mr Cody Whitney is here today for a stroke follow-up unaccompanied. Reports occasional word finding difficulty which has been persistent since his stroke, denies new stroke/TIA symptoms. Remains on Eliquis and Atorvastatin without side effects. BP today 124/67. Labs A1c 6.1 (03/2021), LDL 66 (09/09/2021).  No concerns at this time.    History provided for reference purposes only Initial visit 04/29/2021 JM: [name] is being seen for hospital follow-up unaccompanied.  He has been doing very well since discharge without any new or reoccurring stroke/TIA symptoms.  He has remained on Eliquis as well as atorvastatin without any associated side effects.  Blood pressure today 134/86.  He routinely monitors at home and typically stable.  He has since been seen by PCP and cardiology.  No concerns at this time.  Stroke admission 03/10/2021 Cody Whitney is a 71 yo male with PMHx of A-fib, HLD, HTN, migraines who presented  from [facility]10/2021 with word finding difficulties since 5/9.  Personally reviewed hospitalization pertinent progress notes, lab work and imaging with summary provided.  Evaluated by Dr. Pearlean BrownieSethi with stroke work-up revealing subacute left MCA distribution infarct involving the left insula and overlying left frontal operculum likely due to atrial fibrillation not on anticoagulation.  CTA head/neck negative.  2D echo EF 55 to 60% with left atrial size mildly dilated.  LDL 116.  A1c 6.1.  Recommend initiating Eliquis 5 mg twice daily for  secondary stroke prevention and history of A. fib as well as initiate atorvastatin 20 mg daily.  He was not previously on anticoagulation given low CHA2DS2-VASc of 1.  Other stroke risk factors include advanced age, prior tobacco use and prior history of stroke right PCA distribution infarct seen on imaging.      ROS:   14 system review of systems performed and negative with exception of no complaints  PMH:  Past Medical History:  Diagnosis Date   Atrial fibrillation (HCC) 11/10/2011   Headache    Stroke (HCC)     PSH:  Past Surgical History:  Procedure Laterality Date   FACIAL FRACTURE SURGERY     TONSILLECTOMY      Social History:  Social History   Socioeconomic History   Marital status: Widowed    Spouse name: Not on file   Number of children: 1   Years of education: Masters   Highest education level: Not on file  Occupational History   Occupation: Retired  Tobacco Use   Smoking status: Former    Packs/day: 0.50    Types: Cigarettes    Quit date: 10/13/2021    Years since quitting: 0.0   Smokeless tobacco: Never  Vaping Use   Vaping Use: Never used  Substance and Sexual Activity   Alcohol use: No    Comment: Last used 1980   Drug use: No   Sexual activity: Not on file  Other Topics Concern   Not on file  Social History Narrative   Lives at home alone.  Left-handed.   2-3 cups caffeine daily.   Social Determinants of Health   Financial Resource Strain: Not on file  Food Insecurity: Not on file  Transportation Needs: Not on file  Physical Activity: Not on file  Stress: Not on file  Social Connections: Not on file  Intimate Partner Violence: Not on file    Family History:  Family History  Adopted: Yes  Problem Relation Age of Onset   Atrial fibrillation Sister    Migraines Sister     Medications:   Current Outpatient Medications on File Prior to Visit  Medication Sig Dispense Refill   apixaban (ELIQUIS) 5 MG TABS tablet Take 1 tablet (5  mg total) by mouth 2 (two) times daily. 60 tablet 1   atorvastatin (LIPITOR) 20 MG tablet Take 1 tablet (20 mg total) by mouth daily. 30 tablet 1   metoprolol succinate (TOPROL-XL) 25 MG 24 hr tablet Take 1 tablet (25 mg total) by mouth daily. 90 tablet 3   Multiple Vitamins-Minerals (CENTRUM SILVER PO) Take 1 tablet by mouth daily.      No current facility-administered medications on file prior to visit.    Allergies:   Allergies  Allergen Reactions   Hydrocodone Nausea And Vomiting      OBJECTIVE:  Physical Exam  Vitals:   11/12/21 0944  BP: 124/67  Pulse: 73  Weight: 221 lb 6.4 oz (100.4 kg)  Height: 6' (1.829 m)    Body mass index is 30.03 kg/m. No results found.  General: well developed, well nourished, very pleasant elderly Caucasian male, seated, in no evident distress Head: head normocephalic and atraumatic.   Neck: supple with no carotid or supraclavicular bruits Cardiovascular: irregular rate and rhythm, no murmurs Musculoskeletal: no deformity Skin:  no rash/petichiae Vascular:  Normal pulses all extremities   Neurologic Exam Mental Status: Awake and fully alert. Fluent speech and language. Unable to appreciate aphasia or dysarthria. Oriented to place and time. Recent and remote memory intact. Attention span, concentration and fund of knowledge appropriate. Mood and affect appropriate.  Cranial Nerves: Pupils equal, briskly reactive to light. Extraocular movements full without nystagmus. Visual fields full to confrontation. Hearing intact. Facial sensation intact.  Facial asymmetry - ?chronic - denies this being new.  tongue, palate moves normally and symmetrically.  Motor: Normal bulk and tone. Normal strength in all tested extremity muscles except slight decrease right hand dexterity, orbits left arm over right arm and mild R HF weakness - did not appreciate on prior exam but noted during hospitalization Sensory: intact to touch , pinprick , position and  vibratory sensation.  Coordination: Rapid alternating movements normal in all extremities except right hand. Finger-to-nose and heel-to-shin performed accurately bilaterally.  Gait and Station: Arises from chair without difficulty. Stands with arms crossed. Stance is normal. Gait demonstrates normal stride length and balance without use of assistive device. Tandem walk and heel toe with mild difficulty. Able to stand on single leg alone. Reflexes: 1+ and symmetric. Toes downgoing.        ASSESSMENT: Cody Whitney is a 71 y.o. year old male with recent subacute left MCA distribution infarct involving the left insula and overlying left frontal operculum likely due to A. fib not on AC on 03/10/2021. Vascular risk factors include A. fib, HTN, HLD, migraines, former tobacco use, and prior history of stroke (chronic R PCA on imaging).      PLAN:  Embolic left MCA stroke:  Residual deficit: intermittent occasional aphasia and slight right sided weakness -  denies this being new and present since stroke although not appreciated on prior exam. Will hold off on repeat imaging per pt request as current plan would not change and he does not believe these are new findings but discussed stroke warning signs and call 911 immediately if these should occur.  Continue Eliquis (apixaban) daily  and atorvastatin 20 mg daily for secondary stroke prevention Discussed secondary stroke prevention measures and importance of close PCP follow up for aggressive stroke risk factor management  Atrial fibrillation: On Eliquis 5 mg twice daily for CHA2DS2-VASc score of at least 4 (age, HTN, hx stroke) routinely followed by cardiologist Dr. Acie Fredrickson HTN: BP goal <130/90.  Stable on current regimen per PCP/cardiology HLD: LDL goal <70. Recent LDL 66 (09/2021) on atorvastatin 40 mg daily managed by PCP    Follow up in 6 months or call earlier if needed - if stable, will plan on f/u as needed   CC:  PCP: Kristen Loader,  FNP    I spent 34 minutes of face-to-face and non-face-to-face time with patient.  This included previsit chart review, lab review, study review, electronic health record documentation, and patient education and discussion regarding prior stroke and residual deficits including etiology, secondary stroke prevention and aggressive stroke risk factor management and answered all questions to patient satisfaction   Frann Rider, [facility]  [facility] [address] [place], [place] [address]  Phone [phone] Fax [phone] Note: This document was prepared with digital dictation and possible smart phrase technology. Any transcriptional errors that result from this process are unintentional.

## 2021-11-12 NOTE — Patient Instructions (Signed)
Continue Eliquis (apixaban) daily  and atorvastatin for secondary stroke prevention ° °Continue to follow up with PCP regarding cholesterol and blood pressure management  °Maintain strict control of hypertension with blood pressure goal below 130/90 and cholesterol with LDL cholesterol (bad cholesterol) goal below 70 mg/dL.  ° °Signs of a Stroke? Follow the BEFAST method:  °Balance Watch for a sudden loss of balance, trouble with coordination or vertigo °Eyes Is there a sudden loss of vision in one or both eyes? Or double vision?  °Face: Ask the person to smile. Does one side of the face droop or is it numb?  °Arms: Ask the person to raise both arms. Does one arm drift downward? Is there weakness or numbness of a leg? °Speech: Ask the person to repeat a simple phrase. Does the speech sound slurred/strange? Is the person confused ? °Time: If you observe any of these signs, call 911. ° ° ° ° ° °Thank you for coming to see us at [facility]. I hope we have been able to provide you high quality care today. ° °You may receive a patient satisfaction survey over the next few weeks. We would appreciate your feedback and comments so that we may continue to improve ourselves and the health of our patients. ° °

## 2022-02-02 ENCOUNTER — Other Ambulatory Visit: Payer: Self-pay | Admitting: Urology

## 2022-02-02 DIAGNOSIS — R972 Elevated prostate specific antigen [PSA]: Secondary | ICD-10-CM

## 2022-02-19 ENCOUNTER — Other Ambulatory Visit: Payer: Self-pay

## 2022-02-19 DIAGNOSIS — Z122 Encounter for screening for malignant neoplasm of respiratory organs: Secondary | ICD-10-CM

## 2022-02-19 DIAGNOSIS — Z87891 Personal history of nicotine dependence: Secondary | ICD-10-CM

## 2022-03-03 ENCOUNTER — Ambulatory Visit
Admission: RE | Admit: 2022-03-03 | Discharge: 2022-03-03 | Disposition: A | Discharge status: Home / Self Care | Payer: Medicare Other | Source: Ambulatory Visit | Attending: Urology | Admitting: Urology

## 2022-03-03 DIAGNOSIS — R972 Elevated prostate specific antigen [PSA]: Secondary | ICD-10-CM

## 2022-03-03 IMAGING — MR MR PROSTATE WO/W CM
12 series · 48 of 48 positions shown · IV contrast (multihance)
Comparison: None Available.

CLINICAL DATA: Elevated PSA.

EXAM:
MR PROSTATE WITHOUT AND WITH CONTRAST
TECHNIQUE: Multiplanar multisequence MRI images were obtained of the pelvis
centered about the prostate. Pre and post contrast images were
obtained.
CONTRAST:  20mL MULTIHANCE GADOBENATE DIMEGLUMINE 529 MG/ML IV SOLN

[Series 3: T2 · coronal · 3.0mm · 0.56mm/px · 1 of 27 slices shown (1 of 3)]
[im 1/27]
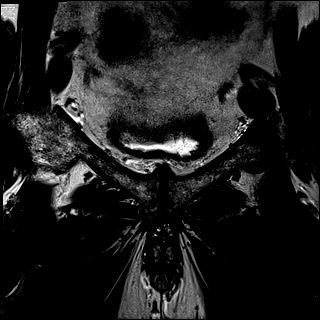

[Series 4: T1 · axial · 5.0mm · 1.25mm/px · 1 of 96 slices shown]
[im 1/96]
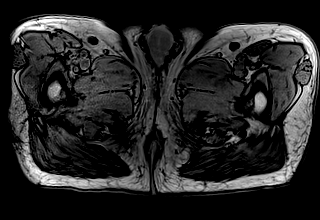

[Series 5: DWI · axial · 3.0mm · 1.75mm/px · z∈[-68,+25]mm · 2 of 95 slices shown (1 of 3)]
[im 1/95]
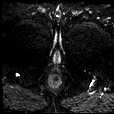
[im 95/95]
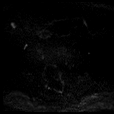

[Series 6: DWI · axial · 3.0mm · 1.75mm/px · 1 of 32 slices shown (2 of 3)]
[im 1/32]
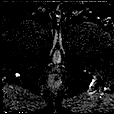

[Series 7: DWI · axial · 3.0mm · 1.75mm/px · 1 of 32 slices shown (3 of 3)]
[im 1/32]
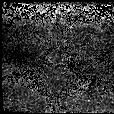

[Series 9: T2 · axial · 1.0mm · 1.04mm/px · z∈[-66,+21]mm · 2 of 88 slices shown (2 of 3)]
[im 1/88]
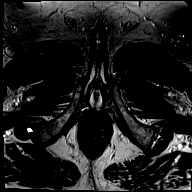
[im 88/88]
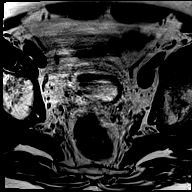

[Series 10: T2 · axial · 3.0mm · 0.56mm/px · 1 of 28 slices shown (3 of 3)]
[im 1/28]
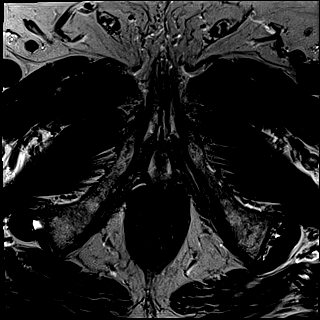

[Series 11: pre t1_twist_tra_dyn · axial · non-contrast · 3.5mm · 0.83mm/px · 1 of 24 slices shown]
[im 1/24]
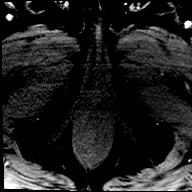

[Series 12: post t1_twist_tra_dyn-copy center · axial · non-contrast · 3.5mm · 0.83mm/px · z∈[-61,+19]mm · 17 of 720 slices shown]
[im 1/720]
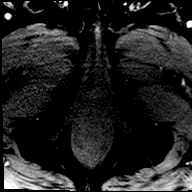
[im 45/720]
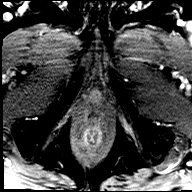
[im 90/720]
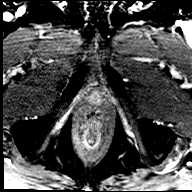
[im 135/720]
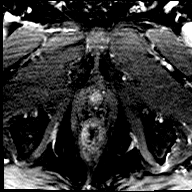
[im 180/720]
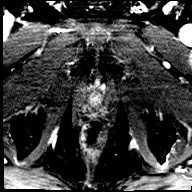
[im 225/720]
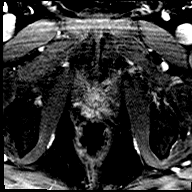
[im 270/720]
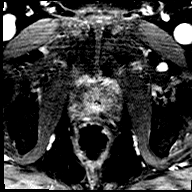
[im 315/720]
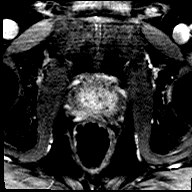
[im 360/720]
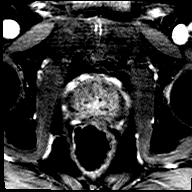
[im 405/720]
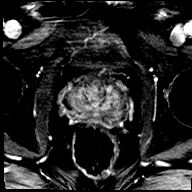
[im 450/720]
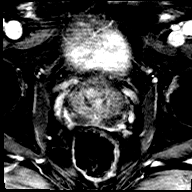
[im 495/720]
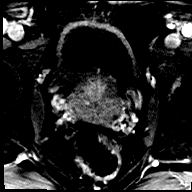
[im 540/720]
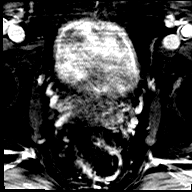
[im 585/720]
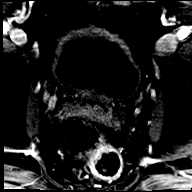
[im 630/720]
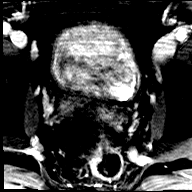
[im 675/720]
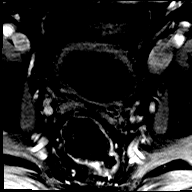
[im 720/720]
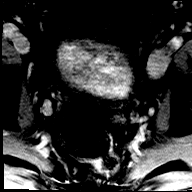

[Series 13: post t1_twist_tra_dyn-copy cent_sub · axial · 3.5mm · 0.83mm/px · z∈[-61,+19]mm · 17 of 696 slices shown]
[im 1/696]
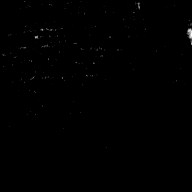
[im 44/696]
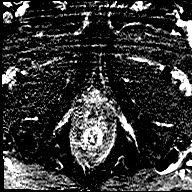
[im 87/696]
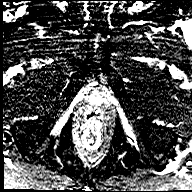
[im 131/696]
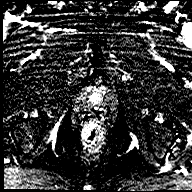
[im 174/696]
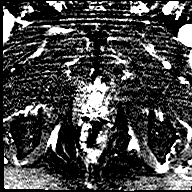
[im 218/696]
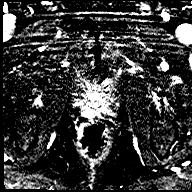
[im 261/696]
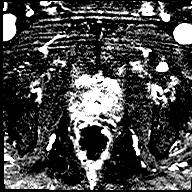
[im 305/696]
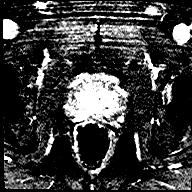
[im 348/696]
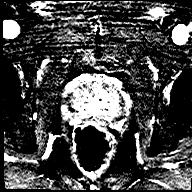
[im 391/696]
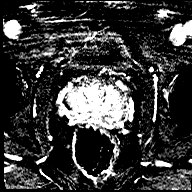
[im 435/696]
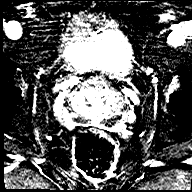
[im 478/696]
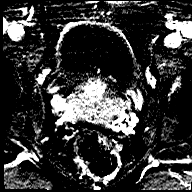
[im 522/696]
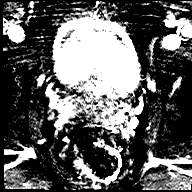
[im 565/696]
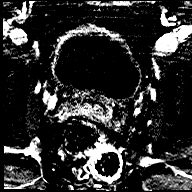
[im 609/696]
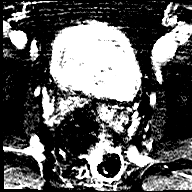
[im 652/696]
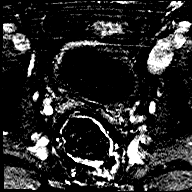
[im 696/696]
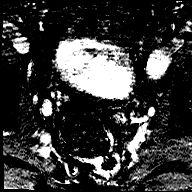

[Series 14: t1_vibe_dixon_tra_f · axial · 2.5mm · 0.91mm/px · z∈[-76,+121]mm · 2 of 80 slices shown]
[im 1/80]
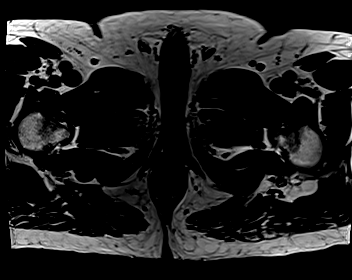
[im 80/80]
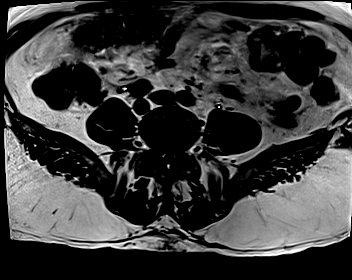

[Series 15: t1_vibe_dixon_tra_w · axial · 2.5mm · 0.91mm/px · z∈[-76,+121]mm · 2 of 80 slices shown]
[im 1/80]
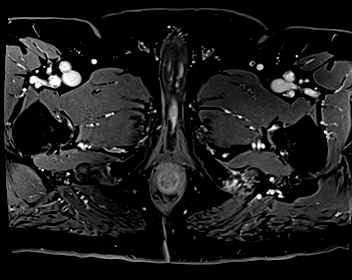
[im 80/80]
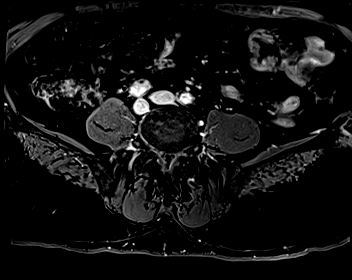

[48 of 48 positions shown; findings below may reference images not displayed]

FINDINGS: Prostate:

-- Peripheral Zone: Linear/wedge shaped hypointensities are noted on
ADC; however, no focal ADC hypointense or high b-value DWI
hyperintense nodules are identified.

-- Transition/Central Zone: Mildly enlarged with diffuse involvement
by BPH nodules. No nodules with suspicious characteristics on
T2-weighted imaging.

-- Measurements/Volume:  4.3 by 3.9 x 5.6 cm (volume = 49 cm^3)

Transcapsular spread:  Absent

Seminal vesicle involvement:  Absent

Neurovascular bundle involvement:  Absent

Pelvic adenopathy: None visualized

Bone metastasis: None visualized

Other:  None
IMPRESSION: No radiographic evidence of high-grade prostate carcinoma. PI-RADS 2
(v.2.1): Low (clinically significant cancer unlikely)

## 2022-03-03 MED ORDER — GADOBENATE DIMEGLUMINE 529 MG/ML IV SOLN
20.0000 mL | Freq: Once | INTRAVENOUS | Status: AC | PRN
  Administered 2022-03-03: 20 mL via INTRAVENOUS

## 2022-03-08 ENCOUNTER — Ambulatory Visit
Admission: RE | Admit: 2022-03-08 | Discharge: 2022-03-08 | Disposition: A | Discharge status: Home / Self Care | Payer: Medicare Other | Source: Ambulatory Visit | Attending: Family Medicine | Admitting: Family Medicine

## 2022-03-08 DIAGNOSIS — Z87891 Personal history of nicotine dependence: Secondary | ICD-10-CM | POA: Diagnosis not present

## 2022-03-08 DIAGNOSIS — J439 Emphysema, unspecified: Secondary | ICD-10-CM | POA: Diagnosis not present

## 2022-03-08 DIAGNOSIS — J948 Other specified pleural conditions: Secondary | ICD-10-CM | POA: Diagnosis not present

## 2022-03-08 DIAGNOSIS — I2510 Atherosclerotic heart disease of native coronary artery without angina pectoris: Secondary | ICD-10-CM | POA: Diagnosis not present

## 2022-03-08 DIAGNOSIS — Z122 Encounter for screening for malignant neoplasm of respiratory organs: Secondary | ICD-10-CM

## 2022-03-08 IMAGING — CT CT CHEST LUNG CANCER SCREENING LOW DOSE W/O CM
1 of 2 series · 10 of 40 positions shown, 13 images · non-contrast
Comparison: [DATE]

CLINICAL DATA: 71 year old male with 38 pack-year history of
smoking. Lung cancer screening.



[ct lung segmentation data · axial · 0.77mm/px · z∈[+765,+765]mm · 10 of 355 frames shown]
[frame 1/355  mediastinal]
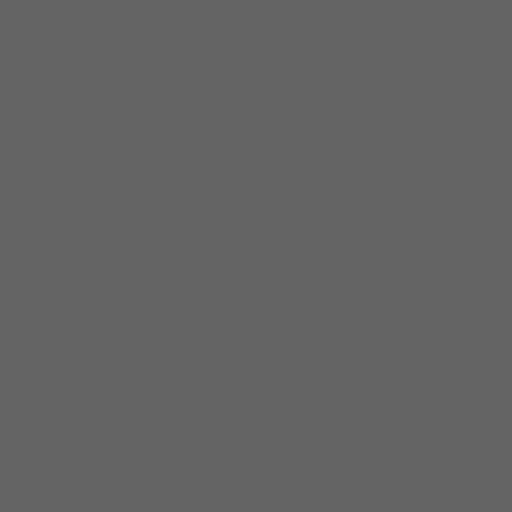
[frame 1/355  lung]
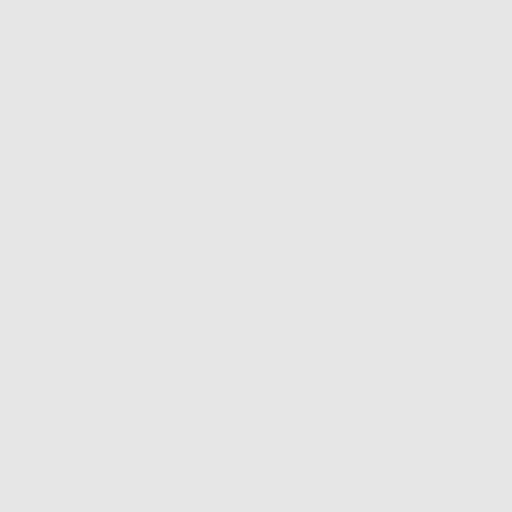
[frame 40/355  lung]
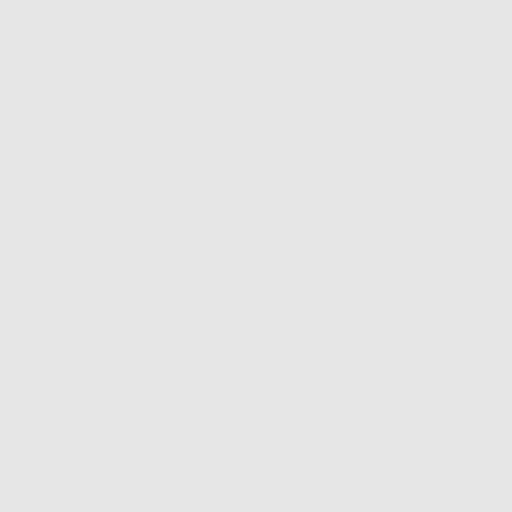
[frame 79/355  lung]
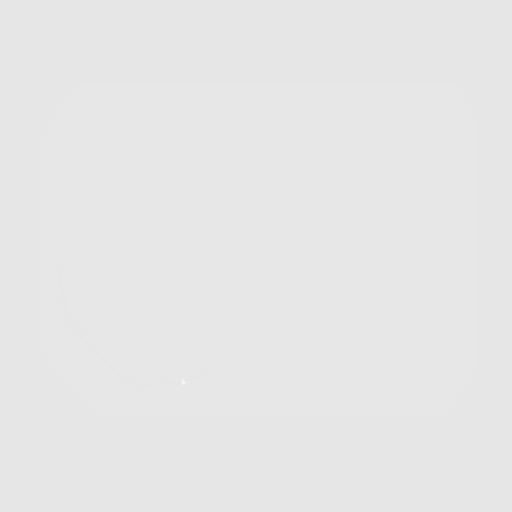
[frame 119/355  lung]
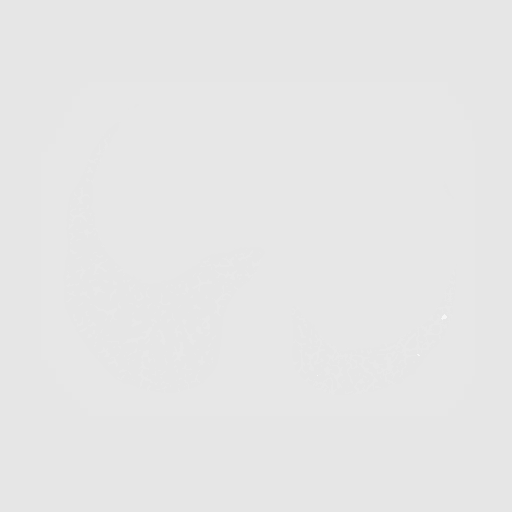
[frame 158/355  mediastinal]
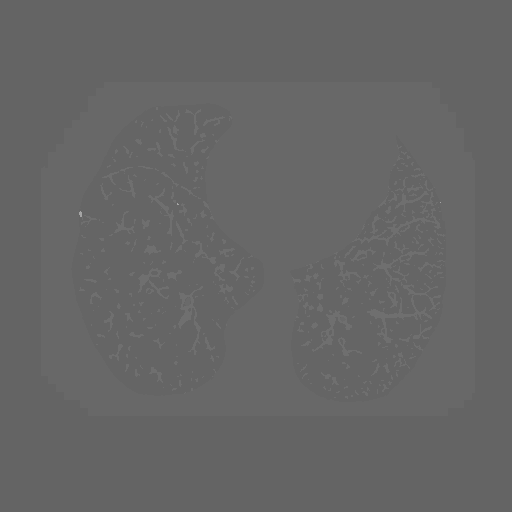
[frame 158/355  lung]
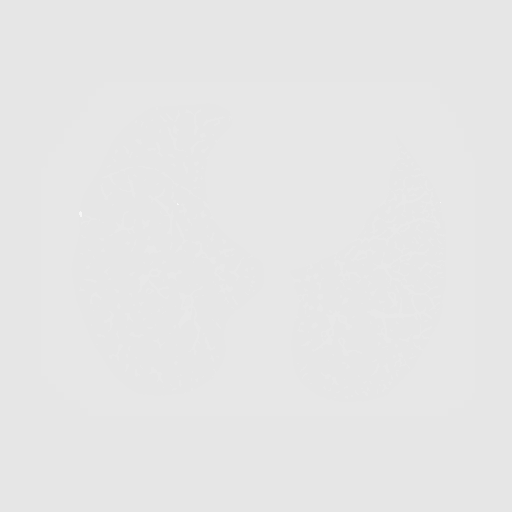
[frame 197/355  lung]
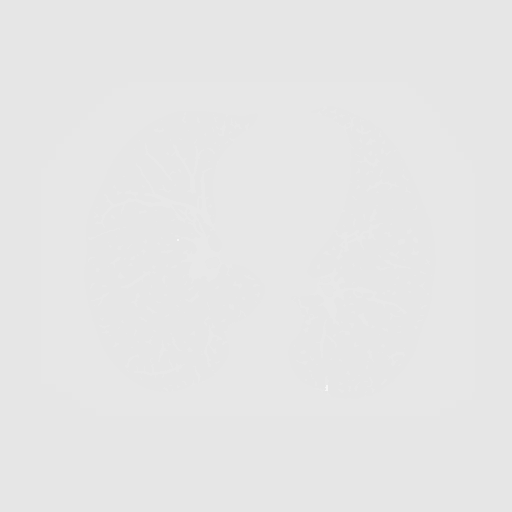
[frame 237/355  lung]
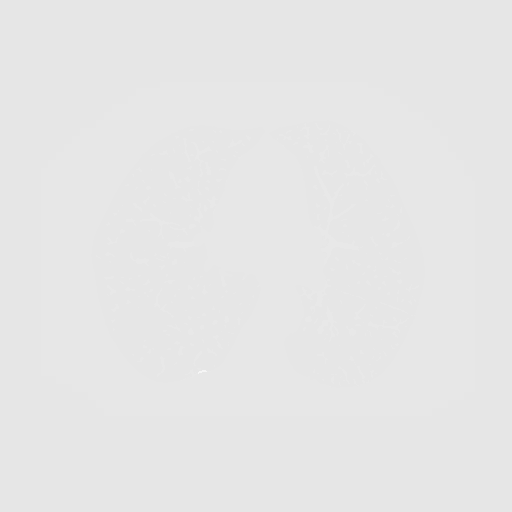
[frame 276/355  lung]
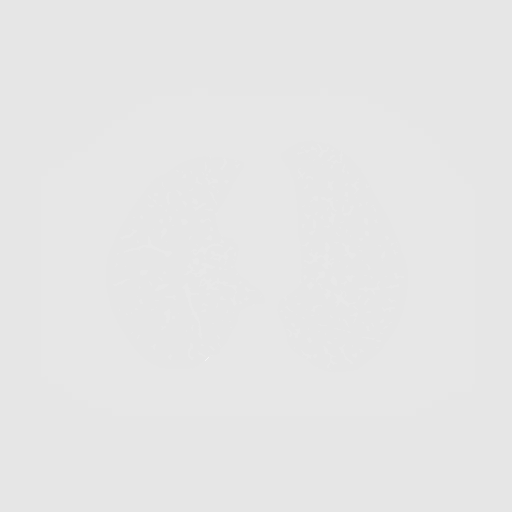
[frame 315/355  mediastinal]
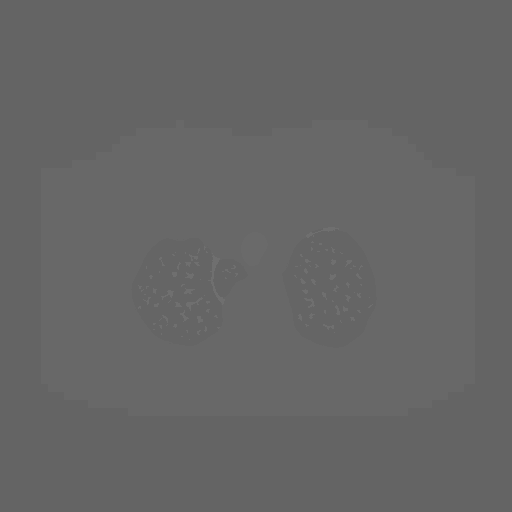
[frame 315/355  lung]
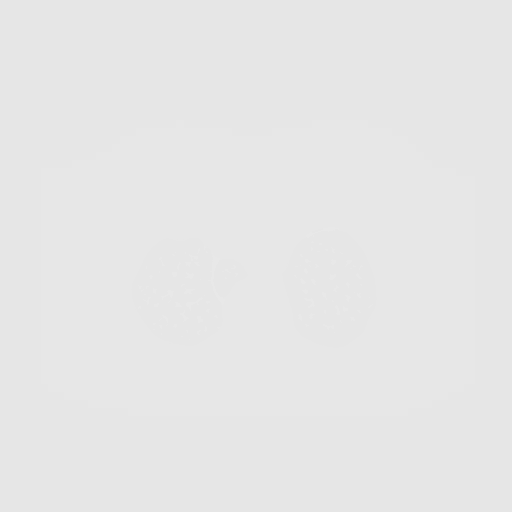
[frame 355/355  lung]
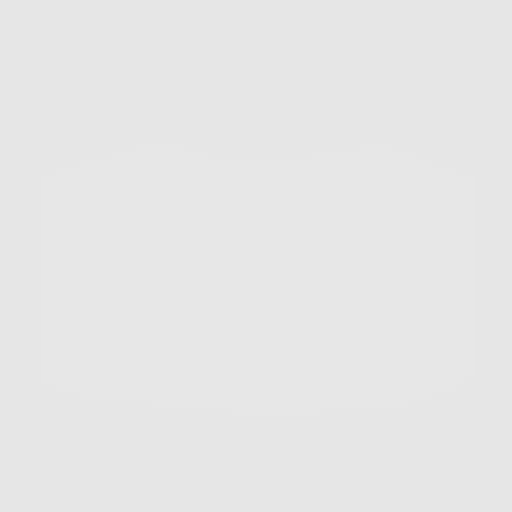

[10 of 40 positions shown; findings below may reference images not displayed]

FINDINGS: Cardiovascular: The heart size is normal. No substantial pericardial
effusion. Coronary artery calcification is evident. Moderate
atherosclerotic calcification is noted in the wall of the thoracic
aorta.

Mediastinum/Nodes: No mediastinal lymphadenopathy. No evidence for
gross hilar lymphadenopathy although assessment is limited by the
lack of intravenous contrast on the current study. The esophagus has
normal imaging features. There is no axillary lymphadenopathy.

Lungs/Pleura: Centrilobular emphsyema noted. Scattered tiny
bilateral calcified and noncalcified pulmonary nodules identified
previously are stable in the interval. New 5.4 mm anterior left
lower lobe nodule (image 195) is present on today's study. No focal
airspace consolidation. No pleural effusion.

Upper Abdomen: Unremarkable.

Musculoskeletal: No worrisome lytic or sclerotic osseous
abnormality.
IMPRESSION: 1. Lung-RADS 3, probably benign findings. Short-term follow-up in 6
months is recommended with repeat low-dose chest CT without contrast
(please use the following order, "CT CHEST LCS NODULE FOLLOW-UP W/O
CM").
2.  Emphysema ([1C]-[1C]) and Aortic Atherosclerosis ([1C]-170.0)

These results will be called to the ordering clinician or
representative by the Radiologist Assistant, and communication
documented in the PACS or [REDACTED].

## 2022-03-09 ENCOUNTER — Telehealth: Payer: Self-pay | Admitting: Acute Care

## 2022-03-09 DIAGNOSIS — Z87891 Personal history of nicotine dependence: Secondary | ICD-10-CM

## 2022-03-09 DIAGNOSIS — R911 Solitary pulmonary nodule: Secondary | ICD-10-CM

## 2022-03-09 NOTE — Telephone Encounter (Signed)
Call report on LDCT done 03/08/22  ? ?Impression: ? ?IMPRESSION: ?1. Lung-RADS 3, probably benign findings. Short-term follow-up in 6 ?months is recommended with repeat low-dose chest CT without contrast ?(please use the following order, "CT CHEST LCS NODULE FOLLOW-UP W/O ?CM"). ?2.  Emphysema (ICD10-J43.9) and Aortic Atherosclerosis (ICD10-170.0) ?  ?These results will be called to the ordering clinician or ?representative by the Radiologist Assistant, and communication ?documented in the PACS or [employer]. ?  ?  ?Electronically Signed ?  By: Kennith Center M.D. ?  On: 03/09/2022 08:54 ?  ?Routing to Maralyn Sago to be called through the screening program.  ?

## 2022-03-10 NOTE — Telephone Encounter (Signed)
I have attempted to call the patient with the results of their  Low Dose CT Chest Lung cancer screening scan. There was no answer. I have left a VM requesting the patient call the office for the scan results. I included the office contact information in the message. We will await his return call. If no return call we will continue to call until patient is contacted.   ?LR 3  ?Pt. Needs a 6 month follow up scan.  ?

## 2022-03-10 NOTE — Telephone Encounter (Signed)
I have called the patient with the results of his low dose CT. I explained that his scan was read as a Lung  RADS 3, nodules that are probably benign findings, short term follow up suggested: includes nodules with a low likelihood of becoming a clinically active cancer. Radiology recommends a 6 month repeat LDCT follow up. He states he has not been sick or had any change in his baseline respiratory status. He is in agreement with a 6 month follow up low dose CT Chest.  ?Angelique Blonder, please order 6 month follow up low dose CT Chest and please fax results to PCP and let them know plan is for a 6 month follow up. Thanks so much ?

## 2022-03-10 NOTE — Telephone Encounter (Signed)
6 month follow up CT ordered and results/plan faxed to PCP ?

## 2022-04-06 ENCOUNTER — Ambulatory Visit: Payer: Medicare Other | Admitting: Cardiovascular Disease

## 2022-04-06 ENCOUNTER — Encounter: Payer: Self-pay | Admitting: Cardiovascular Disease

## 2022-04-06 VITALS — BP 122/78 | HR 61 | Ht 72.0 in | Wt 225.6 lb

## 2022-04-06 DIAGNOSIS — I4821 Permanent atrial fibrillation: Secondary | ICD-10-CM | POA: Diagnosis not present

## 2022-04-06 MED ORDER — METOPROLOL SUCCINATE ER 25 MG PO TB24: 25.0000 mg | ORAL_TABLET | Freq: Every day | ORAL | 3 refills | Status: DC

## 2022-04-06 NOTE — Progress Notes (Signed)
Cardiology Office Note   Date:  04/08/2022   ID:  Cody Whitney, DOB [birth year], MRN [id number]  PCP:  Cody Pilon, FNP  Cardiologist:   Cody Miss, MD   Chief Complaint  Patient presents with   Atrial Fibrillation   Hyperlipidemia   Hypertension   Cody Whitney is a 71 yo gentleman with a hx of A-fib.  He is a previous patient of Dr. Reyes Whitney and then Dr. Deborah Whitney. He walks regularly at work.  He has no limitations doing his yardwork.  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 He's not had any episodes of chest pain or shortness breath.  Mar 29, 2013:  Cody Whitney is doing well.  He is busy taking care of his wife who has cancer.   He has not been exercising much because of his busy schedule  April 01, 2014:  Cody Whitney is doing ok.       Cody Whitney is a 71 y.o. male who presents for further eval of his atrial fib.  He is 71 years old and is on aspirin alone. He's on metoprolol for his rate control. He remains completely asymptomatic. He's able to do all of his normal activities without any significant problems.  May 15 ,2017:  Cody Whitney is doing well.   Has well controlled atrial fib  CHADS2VASC is 1 ( age)  No HTN Very active around the house- just retired several weeks ago   Mar 21, 2017:  Cody Whitney is doing well .  Seen for follow up of his atrial fib  Has joined [employer] .  Has chronic AF  - CHADS2VASC is 1 ( age)   Mar 22, 2018  Doing well. CHADs2VASC is 1 age - no hx of HTN  Works out some . limite by migraine headaches , is on Imitrex which helps   Mar 27, 2020 Cody Whitney is seen today for follow up of his atrial fib CHADS2VASC is 1 ( age)  Did a telemedicine visit last year.   Walking some .   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  Has had both covid vaccines.  Lipids are mildly elevated.   Does not want to start a  Statin  April 03, 2021: Cody Whitney is seen today for follow up of his  atrial fib. He has borderline HTN He has finally agreed to take Eliquis after having a small stroke .  He has recovered nicely   [facility] is now 4 ( HTN, age, CVA  )  His readings at home are around 130/80  Is a previous smoker Has had low dose CT scans of his lungs Over all is doing well   April 06, 2022: Cody Whitney is seen today for follow-up of his atrial fibrillation.  He has a history of hypertension.  His blood pressure is very well controlled today.  He is tolerating the Eliquis.  There is no evidence of any bleeding.  No chest pain or shortness of breath Able to do his normal activities     Past Medical History:  Diagnosis Date   Atrial fibrillation (HCC) 11/10/2011   Headache    Stroke [facility])     Past Surgical History:  Procedure Laterality Date   FACIAL FRACTURE SURGERY     TONSILLECTOMY       Current Outpatient Medications  Medication Sig Dispense Refill   apixaban (ELIQUIS) 5 MG TABS tablet Take 1 tablet (5 mg total)  by mouth 2 (two) times daily. 60 tablet 1   atorvastatin (LIPITOR) 20 MG tablet Take 1 tablet (20 mg total) by mouth daily. 30 tablet 1   Multiple Vitamins-Minerals (CENTRUM SILVER PO) Take 1 tablet by mouth daily.      metoprolol succinate (TOPROL-XL) 25 MG 24 hr tablet Take 1 tablet (25 mg total) by mouth daily. 90 tablet 3   No current facility-administered medications for this visit.    Allergies:   Hydrocodone    Social History:  The patient  reports that he quit smoking about 5 months ago. His smoking use included cigarettes. He smoked an average of .5 packs per day. He has never used smokeless tobacco. He reports that he does not drink alcohol and does not use drugs.   Family History:  The patient's family history includes Atrial fibrillation in his sister; Migraines in his sister. He was adopted.    ROS:  Please see the history of present illness.    Physical Exam: Blood pressure 122/78, pulse 61, height 6' (1.829 m), weight 225 lb 9.6 oz  (102.3 kg), SpO2 97 %.  GEN:  Well nourished, well developed in no acute distress HEENT: Normal NECK: No JVD; No carotid bruits LYMPHATICS: No lymphadenopathy CARDIAC: irreg. Irreg.  RESPIRATORY:  Clear to auscultation without rales, wheezing or rhonchi  ABDOMEN: Soft, non-tender, non-distended MUSCULOSKELETAL:  No edema; No deformity  SKIN: Warm and dry NEUROLOGIC:  Alert and oriented x 3   EKG:   April 06, 2022: Atrial fibrillation with a heart rate of 61.  No ST or T wave changes.   Recent Labs: No results found for requested labs within last 365 days.    Lipid Panel    Component Value Date/Time   CHOL 168 03/10/2021 2310   TRIG 78 03/10/2021 2310   HDL 36 (L) 03/10/2021 2310   CHOLHDL 4.7 03/10/2021 2310   VLDL 16 03/10/2021 2310   LDLCALC 116 (H) 03/10/2021 2310   LDLDIRECT 159.9 11/05/2011 0856      Wt Readings from Last 3 Encounters:  04/06/22 225 lb 9.6 oz (102.3 kg)  11/12/21 221 lb 6.4 oz (100.4 kg)  04/29/21 224 lb (101.6 kg)      Other studies Reviewed: Additional studies/ records that were reviewed today include: . Review of the above records demonstrates:    ASSESSMENT AND PLAN:  1.  Atrial Fibrillation:    This patients CHA2DS2-VASc Score is 4 ( age, borderline HTN, CVA )   Tolerating eliquis well .   2.  Borderline hypertension:   BP is well controlled.    3.  Hyperlipidemia: - per primary MD       Current medicines are reviewed at length with the patient today.  The patient does not have concerns regarding medicines.  The following changes have been made:  no change  Labs/ tests ordered today include:   Orders Placed This Encounter  Procedures   EKG 12-Lead     Disposition:  Will have him follow up with Dr. Shari Prows when I retire. Will see an APP in a year.      Cody Miss, MD  04/08/2022 6:37 PM    [facility] [address] [place], [place], [place]  [address] Phone: [phone]; Fax: [phone]

## 2022-04-06 NOTE — Patient Instructions (Signed)
Medication Instructions:  Your physician recommends that you continue on your current medications as directed. Please refer to the Current Medication list given to you today.  *If you need a refill on your cardiac medications before your next appointment, please call your pharmacy*   Lab Work: NONE If you have labs (blood work) drawn today and your tests are completely normal, you will receive your results only by: MyChart Message (if you have MyChart) OR A paper copy in the mail If you have any lab test that is abnormal or we need to change your treatment, we will call you to review the results.   Testing/Procedures: NONE   Follow-Up: At [facility], you and your health needs are our priority.  As part of our continuing mission to provide you with exceptional heart care, we have created designated Provider Care Teams.  These Care Teams include your primary Cardiologist (physician) and Advanced Practice Providers (APPs -  Physician Assistants and Nurse Practitioners) who all work together to provide you with the care you need, when you need it.  Your next appointment:   1 year(s)  The format for your next appointment:   In Person  Provider:   Tereso Newcomer, PA Eligha Bridegroom, NP   Important Information About Sugar

## 2022-05-12 NOTE — Progress Notes (Unsigned)
[facility] [address] [place]. [place] [address] [phone]       STROKE FOLLOW UP NOTE  [name] Date of Birth:  [birth year] Medical Record Number:  [id number]   Reason for Referral: stroke follow up    SUBJECTIVE:   CHIEF COMPLAINT:  No chief complaint on file.   HPI:   Update 05/12/2022 JM: Patient returns for 9-month stroke follow-up.  Overall stable without new stroke/TIA symptoms.  Reports residual ***.  Compliant on Eliquis and atorvastatin, denies side effects.  Blood pressure today ***.  Reports routine follow-up with PCP as well as cardiology.  No new concerns at this time.     History provided for reference purposes only Update, 11/11/2021, JM: Mr Cody Whitney is here today for a stroke follow-up unaccompanied. Reports occasional word finding difficulty which has been persistent since his stroke, denies new stroke/TIA symptoms. Remains on Eliquis and Atorvastatin without side effects. BP today 124/67. Labs A1c 6.1 (03/2021), LDL 66 (09/09/2021).  No concerns at this time.  Initial visit 04/29/2021 JM: [name] is being seen for hospital follow-up unaccompanied.  He has been doing very well since discharge without any new or reoccurring stroke/TIA symptoms.  He has remained on Eliquis as well as atorvastatin without any associated side effects.  Blood pressure today 134/86.  He routinely monitors at home and typically stable.  He has since been seen by PCP and cardiology.  No concerns at this time.  Stroke admission 03/10/2021 [place] Cody Whitney is a 71 yo male with PMHx of A-fib, HLD, HTN, migraines who presented  from [facility] on 03/10/2021 with word finding difficulties since 5/9.  Personally reviewed hospitalization pertinent progress notes, lab work and imaging with summary provided.  Evaluated by Dr. Pearlean Brownie with stroke work-up revealing subacute left MCA distribution infarct involving the left insula and overlying left  frontal operculum likely due to atrial fibrillation not on anticoagulation.  CTA head/neck negative.  2D echo EF 55 to 60% with left atrial size mildly dilated.  LDL 116.  A1c 6.1.  Recommend initiating Eliquis 5 mg twice daily for secondary stroke prevention and history of A. fib as well as initiate atorvastatin 20 mg daily.  He was not previously on anticoagulation given low CHA2DS2-VASc of 1.  Other stroke risk factors include advanced age, prior tobacco use and prior history of stroke right PCA distribution infarct seen on imaging.      ROS:   14 system review of systems performed and negative with exception of no complaints  PMH:  Past Medical History:  Diagnosis Date   Atrial fibrillation (HCC) 11/10/2011   Headache    Stroke (HCC)     PSH:  Past Surgical History:  Procedure Laterality Date   FACIAL FRACTURE SURGERY     TONSILLECTOMY      Social History:  Social History   Socioeconomic History   Marital status: Widowed    Spouse name: Not on file   Number of children: 1   Years of education: Masters   Highest education level: Not on file  Occupational History   Occupation: Retired  Tobacco Use   Smoking status: Former    Packs/day: 0.50    Types: Cigarettes    Quit date: 10/13/2021    Years since quitting: 0.5   Smokeless tobacco: Never  Vaping Use   Vaping Use: Never used  Substance and Sexual Activity   Alcohol use: No    Comment: Last used  1980   Drug use: No   Sexual activity: Not on file  Other Topics Concern   Not on file  Social History Narrative   Lives at home alone.   Left-handed.   2-3 cups caffeine daily.   Social Determinants of Health   Financial Resource Strain: Not on file  Food Insecurity: Not on file  Transportation Needs: Not on file  Physical Activity: Not on file  Stress: Not on file  Social Connections: Not on file  Intimate Partner Violence: Not on file    Family History:  Family History  Adopted: Yes  Problem Relation  Age of Onset   Atrial fibrillation Sister    Migraines Sister     Medications:   Current Outpatient Medications on File Prior to Visit  Medication Sig Dispense Refill   apixaban (ELIQUIS) 5 MG TABS tablet Take 1 tablet (5 mg total) by mouth 2 (two) times daily. 60 tablet 1   atorvastatin (LIPITOR) 20 MG tablet Take 1 tablet (20 mg total) by mouth daily. 30 tablet 1   metoprolol succinate (TOPROL-XL) 25 MG 24 hr tablet Take 1 tablet (25 mg total) by mouth daily. 90 tablet 3   Multiple Vitamins-Minerals (CENTRUM SILVER PO) Take 1 tablet by mouth daily.      No current facility-administered medications on file prior to visit.    Allergies:   Allergies  Allergen Reactions   Hydrocodone Nausea And Vomiting      OBJECTIVE:  Physical Exam  There were no vitals filed for this visit.   There is no height or weight on file to calculate BMI. No results found.  General: well developed, well nourished, very pleasant elderly Caucasian male, seated, in no evident distress Head: head normocephalic and atraumatic.   Neck: supple with no carotid or supraclavicular bruits Cardiovascular: irregular rate and rhythm, no murmurs Musculoskeletal: no deformity Skin:  no rash/petichiae Vascular:  Normal pulses all extremities   Neurologic Exam Mental Status: Awake and fully alert. Fluent speech and language. Unable to appreciate aphasia or dysarthria. Oriented to place and time. Recent and remote memory intact. Attention span, concentration and fund of knowledge appropriate. Mood and affect appropriate.  Cranial Nerves: Pupils equal, briskly reactive to light. Extraocular movements full without nystagmus. Visual fields full to confrontation. Hearing intact. Facial sensation intact.  Facial asymmetry - ?chronic - denies this being new.  tongue, palate moves normally and symmetrically.  Motor: Normal bulk and tone. Normal strength in all tested extremity muscles except slight decrease right hand  dexterity, orbits left arm over right arm and mild R HF weakness - did not appreciate on prior exam but noted during hospitalization Sensory: intact to touch , pinprick , position and vibratory sensation.  Coordination: Rapid alternating movements normal in all extremities except right hand. Finger-to-nose and heel-to-shin performed accurately bilaterally.  Gait and Station: Arises from chair without difficulty. Stands with arms crossed. Stance is normal. Gait demonstrates normal stride length and balance without use of assistive device. Tandem walk and heel toe with mild difficulty. Able to stand on single leg alone. Reflexes: 1+ and symmetric. Toes downgoing.        ASSESSMENT: Cody Whitney is a 71 y.o. year old male with recent subacute left MCA distribution infarct involving the left insula and overlying left frontal operculum likely due to A. fib not on AC on 03/10/2021. Vascular risk factors include A. fib, HTN, HLD, migraines, former tobacco use, and prior history of stroke (chronic R PCA on imaging).  PLAN:  Embolic left MCA stroke:  Residual deficit: intermittent occasional aphasia and slight right sided weakness - denies this being new and present since stroke although not appreciated on prior exam. Will hold off on repeat imaging per pt request as current plan would not change and he does not believe these are new findings but discussed stroke warning signs and call 911 immediately if these should occur.  Continue Eliquis (apixaban) daily  and atorvastatin 20 mg daily for secondary stroke prevention Discussed secondary stroke prevention measures and importance of close PCP follow up for aggressive stroke risk factor management including BP goal<130/90, and HLD with LDL goal<70  Atrial fibrillation: On Eliquis 5 mg twice daily for CHA2DS2-VASc score of at least 4 (age, HTN, hx stroke) routinely followed by cardiologist Dr. Elease Hashimoto     Follow up in 6 months or call earlier  if needed - if stable, will plan on f/u as needed   CC:  PCP: Soundra Pilon, FNP    I spent 34 minutes of face-to-face and non-face-to-face time with patient.  This included previsit chart review, lab review, study review, electronic health record documentation, and patient education and discussion regarding prior stroke and residual deficits including etiology, secondary stroke prevention and aggressive stroke risk factor management and answered all questions to patient satisfaction   Ihor Austin, [facility]  [facility] [address] [place], [place] [address]  Phone [phone] Fax [phone] Note: This document was prepared with digital dictation and possible smart phrase technology. Any transcriptional errors that result from this process are unintentional.

## 2022-05-13 ENCOUNTER — Encounter: Payer: Self-pay | Admitting: Adult Health

## 2022-05-13 ENCOUNTER — Ambulatory Visit: Payer: Medicare Other | Admitting: Adult Health

## 2022-05-13 VITALS — BP 120/70 | HR 58 | Ht 72.0 in | Wt 231.1 lb

## 2022-05-13 DIAGNOSIS — I63412 Cerebral infarction due to embolism of left middle cerebral artery: Secondary | ICD-10-CM | POA: Diagnosis not present

## 2022-05-13 NOTE — Patient Instructions (Signed)
Continue Eliquis (apixaban) daily  and atorvastatin  for secondary stroke prevention  Continue to follow with cardiology for atrial fibrillation and Eliquis management   Continue to follow up with PCP regarding cholesterol and blood pressure management  Maintain strict control of hypertension with blood pressure goal below 130/90 and cholesterol with LDL cholesterol (bad cholesterol) goal below 70 mg/dL.   Signs of a Stroke? Follow the BEFAST method:  Balance Watch for a sudden loss of balance, trouble with coordination or vertigo Eyes Is there a sudden loss of vision in one or both eyes? Or double vision?  Face: Ask the person to smile. Does one side of the face droop or is it numb?  Arms: Ask the person to raise both arms. Does one arm drift downward? Is there weakness or numbness of a leg? Speech: Ask the person to repeat a simple phrase. Does the speech sound slurred/strange? Is the person confused ? Time: If you observe any of these signs, call 911.       Thank you for coming to see [place] at [facility]. I hope we have been able to provide you high quality care today.  You may receive a patient satisfaction survey over the next few weeks. We would appreciate your feedback and comments so that we may continue to improve ourselves and the health of our patients.

## 2022-09-09 ENCOUNTER — Ambulatory Visit
Admission: RE | Admit: 2022-09-09 | Discharge: 2022-09-09 | Disposition: A | Discharge status: Home / Self Care | Payer: Medicare Other | Source: Ambulatory Visit | Attending: Acute Care | Admitting: Acute Care

## 2022-09-09 DIAGNOSIS — R911 Solitary pulmonary nodule: Secondary | ICD-10-CM

## 2022-09-09 DIAGNOSIS — I700 Atherosclerosis of aorta: Secondary | ICD-10-CM | POA: Diagnosis not present

## 2022-09-09 DIAGNOSIS — J439 Emphysema, unspecified: Secondary | ICD-10-CM | POA: Diagnosis not present

## 2022-09-09 DIAGNOSIS — Z87891 Personal history of nicotine dependence: Secondary | ICD-10-CM

## 2022-09-16 ENCOUNTER — Telehealth: Payer: Self-pay | Admitting: Acute Care

## 2022-09-16 DIAGNOSIS — Z87891 Personal history of nicotine dependence: Secondary | ICD-10-CM

## 2022-09-16 DIAGNOSIS — R911 Solitary pulmonary nodule: Secondary | ICD-10-CM

## 2022-09-16 NOTE — Telephone Encounter (Signed)
He needs a 6 month follow up. I know this was read as a  LR 2, but 2 nodules have increased in size in 6 months. One from 4.8-5.5 mm and the other from 4.4 mm - 5 mm.   I have spoken to the patient and he is in agreement with a 6 month follow up scan. Please place order for 6 month follow up and fax results to PCP. Let them know due to some interval growth of 2 nodules, we will re-check in 6 months for stability.  Thanks so much

## 2022-09-16 NOTE — Telephone Encounter (Signed)
CT results faxed to PCP with follow up plans included. Order placed for 6 mth nodule f/u [facility] CT.  

## 2022-09-20 DIAGNOSIS — E782 Mixed hyperlipidemia: Secondary | ICD-10-CM | POA: Diagnosis not present

## 2022-09-20 DIAGNOSIS — I4819 Other persistent atrial fibrillation: Secondary | ICD-10-CM | POA: Diagnosis not present

## 2022-09-20 DIAGNOSIS — J439 Emphysema, unspecified: Secondary | ICD-10-CM | POA: Diagnosis not present

## 2022-09-20 DIAGNOSIS — R7303 Prediabetes: Secondary | ICD-10-CM | POA: Diagnosis not present

## 2022-09-20 DIAGNOSIS — D6869 Other thrombophilia: Secondary | ICD-10-CM | POA: Diagnosis not present

## 2022-09-27 DIAGNOSIS — J439 Emphysema, unspecified: Secondary | ICD-10-CM | POA: Diagnosis not present

## 2022-09-27 DIAGNOSIS — I700 Atherosclerosis of aorta: Secondary | ICD-10-CM | POA: Diagnosis not present

## 2022-09-27 DIAGNOSIS — D6869 Other thrombophilia: Secondary | ICD-10-CM | POA: Diagnosis not present

## 2022-09-27 DIAGNOSIS — Z0000 Encounter for general adult medical examination without abnormal findings: Secondary | ICD-10-CM | POA: Diagnosis not present

## 2022-09-27 DIAGNOSIS — E782 Mixed hyperlipidemia: Secondary | ICD-10-CM | POA: Diagnosis not present

## 2022-09-27 DIAGNOSIS — I4819 Other persistent atrial fibrillation: Secondary | ICD-10-CM | POA: Diagnosis not present

## 2022-09-27 DIAGNOSIS — Z789 Other specified health status: Secondary | ICD-10-CM | POA: Diagnosis not present

## 2022-09-27 DIAGNOSIS — R7303 Prediabetes: Secondary | ICD-10-CM | POA: Diagnosis not present

## 2023-03-16 ENCOUNTER — Ambulatory Visit
Admission: RE | Admit: 2023-03-16 | Discharge: 2023-03-16 | Disposition: A | Discharge status: Home / Self Care | Payer: Medicare Other | Source: Ambulatory Visit | Attending: Acute Care | Admitting: Acute Care

## 2023-03-16 DIAGNOSIS — Z87891 Personal history of nicotine dependence: Secondary | ICD-10-CM

## 2023-03-16 DIAGNOSIS — R911 Solitary pulmonary nodule: Secondary | ICD-10-CM

## 2023-03-16 DIAGNOSIS — J439 Emphysema, unspecified: Secondary | ICD-10-CM | POA: Diagnosis not present

## 2023-03-16 DIAGNOSIS — I700 Atherosclerosis of aorta: Secondary | ICD-10-CM | POA: Diagnosis not present

## 2023-03-21 ENCOUNTER — Other Ambulatory Visit: Payer: Self-pay | Admitting: Acute Care

## 2023-03-21 DIAGNOSIS — Z122 Encounter for screening for malignant neoplasm of respiratory organs: Secondary | ICD-10-CM

## 2023-03-21 DIAGNOSIS — Z87891 Personal history of nicotine dependence: Secondary | ICD-10-CM

## 2023-04-07 DIAGNOSIS — H524 Presbyopia: Secondary | ICD-10-CM | POA: Diagnosis not present

## 2023-04-07 DIAGNOSIS — H53143 Visual discomfort, bilateral: Secondary | ICD-10-CM | POA: Diagnosis not present

## 2023-04-18 ENCOUNTER — Encounter: Payer: Self-pay | Admitting: Cardiovascular Disease

## 2023-04-18 NOTE — Progress Notes (Unsigned)
Cardiology Office Note   Date:  04/19/2023   ID:  Cody Whitney, DOB August 24, 1951, MRN [id number]  PCP:  Soundra Pilon, FNP  Cardiologist:   Kristeen Miss, MD   Chief Complaint  Patient presents with   Atrial Fibrillation        Hyperlipidemia   Cody Whitney is a 72 yo gentleman with a hx of A-fib.  He is a previous patient of Dr. Reyes Ivan and then Dr. Deborah Chalk. He walks regularly at work.  He has no limitations doing his yardwork.  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 He's not had any episodes of chest pain or shortness breath.  Mar 29, 2013:  Cody Whitney is doing well.  He is busy taking care of his wife who has cancer.   He has not been exercising much because of his busy schedule  April 01, 2014:  Cody Whitney is doing ok.       Cody Whitney is a 72 y.o. male who presents for further eval of his atrial fib.  He is 72 years old and is on aspirin alone. He's on metoprolol for his rate control. He remains completely asymptomatic. He's able to do all of his normal activities without any significant problems.  May 15 ,2017:  Cody Whitney is doing well.   Has well controlled atrial fib  CHADS2VASC is 1 ( age)  No HTN Very active around the house- just retired several weeks ago   Mar 21, 2017:  Cody Whitney is doing well .  Seen for follow up of his atrial fib  Has joined [employer] .  Has chronic AF  - CHADS2VASC is 1 ( age)   Mar 22, 2018  Doing well. CHADs2VASC is 1 age - no hx of HTN  Works out some . limite by migraine headaches , is on Imitrex which helps   Mar 27, 2020 Cody Whitney is seen today for follow up of his atrial fib CHADS2VASC is 1 ( age)  Did a telemedicine visit last year.   Walking some .   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  Has had both covid vaccines.  Lipids are mildly elevated.   Does not want to start a  Statin  April 03, 2021: Cody Whitney is seen today for follow up of his atrial  fib. He has borderline HTN He has finally agreed to take Eliquis after having a small stroke .  He has recovered nicely   Cody Whitney is now 4 ( HTN, age, CVA  )  His readings at home are around 130/80  Is a previous smoker Has had low dose CT scans of his lungs Over all is doing well   April 06, 2022: Cody Whitney is seen today for follow-up of his atrial fibrillation.  He has a history of hypertension.  His blood pressure is very well controlled today.  He is tolerating the Eliquis.  There is no evidence of any bleeding.  No chest pain or shortness of breath Able to do his normal activities   April 19, 2023 Cody Whitney is seen for follow up of his atrial fib, HTN, CVA    Past Medical History:  Diagnosis Date   Atrial fibrillation (HCC) 11/10/2011   Headache    Stroke [facility])     Past Surgical History:  Procedure Laterality Date   FACIAL FRACTURE SURGERY     TONSILLECTOMY       Current Outpatient Medications  Medication Sig Dispense Refill   apixaban (ELIQUIS) 5 MG TABS tablet Take 1 tablet (5 mg total) by mouth 2 (two) times daily. 60 tablet 1   atorvastatin (LIPITOR) 20 MG tablet Take 1 tablet (20 mg total) by mouth daily. 30 tablet 1   Multiple Vitamins-Minerals (CENTRUM SILVER PO) Take 1 tablet by mouth daily.      metoprolol succinate (TOPROL-XL) 25 MG 24 hr tablet Take 1 tablet (25 mg total) by mouth daily. 90 tablet 3   No current facility-administered medications for this visit.    Allergies:   Hydrocodone    Social History:  The patient  reports that he quit smoking about 18 months ago. His smoking use included cigarettes. He smoked an average of .5 packs per day. He has never used smokeless tobacco. He reports that he does not drink alcohol and does not use drugs.   Family History:  The patient's family history includes Atrial fibrillation in his sister; Migraines in his sister. He was adopted.    ROS:  Please see the history of present illness.    Physical Exam: Blood  pressure 120/72, pulse 71, height 6' (1.829 m), weight 239 lb 9.6 oz (108.7 kg), SpO2 95 %.       GEN:  Well nourished, well developed in no acute distress HEENT: Normal NECK: No JVD; No carotid bruits LYMPHATICS: No lymphadenopathy CARDIAC: irreg. Irreg.  RESPIRATORY:  Clear to auscultation without rales, wheezing or rhonchi  ABDOMEN: Soft, non-tender, non-distended MUSCULOSKELETAL:  No edema; No deformity  SKIN: Warm and dry NEUROLOGIC:  Alert and oriented x 3   EKG Interpretation  Date/Time:  Tuesday April 19 2023 15:49:55 EDT Ventricular Rate:  71 PR Interval:    QRS Duration: 72 QT Interval:  378 QTC Calculation: 410 R Axis:   -3 Text Interpretation: Atrial fibrillation with a competing junctional pacemaker When compared with ECG of 10-Mar-2021 15:06, PREVIOUS ECG IS PRESENT Confirmed by Kristeen Miss ([id number]) on 04/19/2023 4:05:42 PM      Recent Labs: No results found for requested labs within last 365 days.    Lipid Panel    Component Value Date/Time   CHOL 168 03/10/2021 2310   TRIG 78 03/10/2021 2310   HDL 36 (L) 03/10/2021 2310   CHOLHDL 4.7 03/10/2021 2310   VLDL 16 03/10/2021 2310   LDLCALC 116 (H) 03/10/2021 2310   LDLDIRECT 159.9 11/05/2011 0856      Wt Readings from Last 3 Encounters:  04/19/23 239 lb 9.6 oz (108.7 kg)  05/13/22 231 lb 2 oz (104.8 kg)  04/06/22 225 lb 9.6 oz (102.3 kg)      Other studies Reviewed: Additional studies/ records that were reviewed today include: . Review of the above records demonstrates:    ASSESSMENT AND PLAN:  1.  Atrial Fibrillation:    This patients CHA2DS2-VASc Score is 4 ( age, borderline HTN, CVA )    Stay on eliquis  Cont metoprolol   2.  Borderline hypertension:    well controlled.  Advised more activity    3.  Hyperlipidemia: -   managed by primary MD      Current medicines are reviewed at length with the patient today.  The patient does not have concerns regarding medicines.  The  following changes have been made:  no change  Labs/ tests ordered today include:   Orders Placed This Encounter  Procedures   EKG 12-Lead   EKG 12-Lead        Kristeen Miss, MD  04/19/2023 4:30 PM    [facility] [address] [place], [place], [place]  [address] Phone: [phone]; Fax: [phone]

## 2023-04-19 ENCOUNTER — Ambulatory Visit: Payer: Medicare Other | Attending: Cardiovascular Disease | Admitting: Cardiovascular Disease

## 2023-04-19 ENCOUNTER — Encounter: Payer: Self-pay | Admitting: Cardiovascular Disease

## 2023-04-19 VITALS — BP 120/72 | HR 71 | Ht 72.0 in | Wt 239.6 lb

## 2023-04-19 DIAGNOSIS — I482 Chronic atrial fibrillation, unspecified: Secondary | ICD-10-CM

## 2023-04-19 DIAGNOSIS — I10 Essential (primary) hypertension: Secondary | ICD-10-CM

## 2023-04-19 DIAGNOSIS — I4821 Permanent atrial fibrillation: Secondary | ICD-10-CM

## 2023-04-19 MED ORDER — METOPROLOL SUCCINATE ER 25 MG PO TB24: 25.0000 mg | ORAL_TABLET | Freq: Every day | ORAL | 3 refills | Status: DC

## 2023-04-19 NOTE — Patient Instructions (Signed)
Medication Instructions:  Your physician recommends that you continue on your current medications as directed. Please refer to the Current Medication list given to you today.  *If you need a refill on your cardiac medications before your next appointment, please call your pharmacy*     Follow-Up: At [facility], you and your health needs are our priority.  As part of our continuing mission to provide you with exceptional heart care, we have created designated Provider Care Teams.  These Care Teams include your primary Cardiologist (physician) and Advanced Practice Providers (APPs -  Physician Assistants and Nurse Practitioners) who all work together to provide you with the care you need, when you need it.   Your next appointment:   1 year(s)  Provider:   Philip Nahser, MD     

## 2023-05-20 NOTE — Progress Notes (Signed)
Order(s) created erroneously. Erroneous order ID: [id number]  Order moved by: Ian Malkin  Order move date/time: 05/20/2023 10:14 AM  Source Patient: [name]  Source Contact: 04/19/2023  Destination Patient: [name]  Destination Contact: 04/13/2023

## 2023-08-15 ENCOUNTER — Other Ambulatory Visit: Payer: Self-pay | Admitting: Urology

## 2023-08-15 DIAGNOSIS — R972 Elevated prostate specific antigen [PSA]: Secondary | ICD-10-CM

## 2023-10-03 DIAGNOSIS — J439 Emphysema, unspecified: Secondary | ICD-10-CM | POA: Diagnosis not present

## 2023-10-03 DIAGNOSIS — R7303 Prediabetes: Secondary | ICD-10-CM | POA: Diagnosis not present

## 2023-10-03 DIAGNOSIS — Z0000 Encounter for general adult medical examination without abnormal findings: Secondary | ICD-10-CM | POA: Diagnosis not present

## 2023-10-03 DIAGNOSIS — D6869 Other thrombophilia: Secondary | ICD-10-CM | POA: Diagnosis not present

## 2023-10-03 DIAGNOSIS — E782 Mixed hyperlipidemia: Secondary | ICD-10-CM | POA: Diagnosis not present

## 2023-10-03 DIAGNOSIS — I700 Atherosclerosis of aorta: Secondary | ICD-10-CM | POA: Diagnosis not present

## 2023-10-03 DIAGNOSIS — I4819 Other persistent atrial fibrillation: Secondary | ICD-10-CM | POA: Diagnosis not present

## 2023-10-06 DIAGNOSIS — I4819 Other persistent atrial fibrillation: Secondary | ICD-10-CM | POA: Diagnosis not present

## 2023-10-06 DIAGNOSIS — J439 Emphysema, unspecified: Secondary | ICD-10-CM | POA: Diagnosis not present

## 2023-10-06 DIAGNOSIS — I700 Atherosclerosis of aorta: Secondary | ICD-10-CM | POA: Diagnosis not present

## 2023-10-06 DIAGNOSIS — R7303 Prediabetes: Secondary | ICD-10-CM | POA: Diagnosis not present

## 2023-10-06 DIAGNOSIS — Z0000 Encounter for general adult medical examination without abnormal findings: Secondary | ICD-10-CM | POA: Diagnosis not present

## 2023-10-06 DIAGNOSIS — D6869 Other thrombophilia: Secondary | ICD-10-CM | POA: Diagnosis not present

## 2023-10-11 ENCOUNTER — Ambulatory Visit
Admission: RE | Admit: 2023-10-11 | Discharge: 2023-10-11 | Disposition: A | Discharge status: Home / Self Care | Payer: Medicare Other | Source: Ambulatory Visit | Attending: Urology | Admitting: Urology

## 2023-10-11 DIAGNOSIS — R972 Elevated prostate specific antigen [PSA]: Secondary | ICD-10-CM

## 2023-10-11 MED ORDER — GADOPICLENOL 0.5 MMOL/ML IV SOLN
10.0000 mL | Freq: Once | INTRAVENOUS | Status: AC | PRN
  Administered 2023-10-11: 10 mL via INTRAVENOUS

## 2024-04-03 ENCOUNTER — Other Ambulatory Visit: Payer: Self-pay | Admitting: Emergency Medicine

## 2024-04-03 DIAGNOSIS — Z122 Encounter for screening for malignant neoplasm of respiratory organs: Secondary | ICD-10-CM

## 2024-04-03 DIAGNOSIS — Z87891 Personal history of nicotine dependence: Secondary | ICD-10-CM

## 2024-04-06 DIAGNOSIS — H53143 Visual discomfort, bilateral: Secondary | ICD-10-CM | POA: Diagnosis not present

## 2024-04-06 DIAGNOSIS — H2513 Age-related nuclear cataract, bilateral: Secondary | ICD-10-CM | POA: Diagnosis not present

## 2024-04-11 ENCOUNTER — Ambulatory Visit
Admission: RE | Admit: 2024-04-11 | Discharge: 2024-04-11 | Disposition: A | Discharge status: Home / Self Care | Source: Ambulatory Visit | Attending: Acute Care | Admitting: Acute Care

## 2024-04-11 DIAGNOSIS — Z122 Encounter for screening for malignant neoplasm of respiratory organs: Secondary | ICD-10-CM | POA: Diagnosis not present

## 2024-04-11 DIAGNOSIS — Z87891 Personal history of nicotine dependence: Secondary | ICD-10-CM | POA: Diagnosis not present

## 2024-04-25 ENCOUNTER — Telehealth: Payer: Self-pay

## 2024-04-25 ENCOUNTER — Other Ambulatory Visit: Payer: Self-pay

## 2024-04-25 DIAGNOSIS — Z122 Encounter for screening for malignant neoplasm of respiratory organs: Secondary | ICD-10-CM

## 2024-04-25 DIAGNOSIS — Z87891 Personal history of nicotine dependence: Secondary | ICD-10-CM

## 2024-04-25 NOTE — Telephone Encounter (Signed)
 Results have been reviewed with the patient. Advises he will follow up with Marvene, NP to discuss ILD. Declines appt with pulmonologist at this time. States he will follow up with PCP to discuss findings. Pt has contact information to reach back out if he chooses to see one of our providers. Annual CT order placed. Results and plan to PCP.

## 2024-04-25 NOTE — Telephone Encounter (Signed)
 Called patient to review recent Lung CT results below. Will review results and offer f/u with Dr. Theophilus or [facility] for ILD evaluation.   IMPRESSION: 1. Lung-RADS 2, benign appearance or behavior. Continue annual screening with low-dose chest CT without contrast in 12 months. 2. Peripheral and basilar predominant subpleural reticulation and ground-glass are indicative of interstitial lung disease. If further evaluation is desired, high resolution chest CT without contrast is recommended. 3. Aortic atherosclerosis (ICD10-I70.0). Coronary artery calcification. 4.  Emphysema (ICD10-J43.9).

## 2024-05-04 ENCOUNTER — Other Ambulatory Visit: Payer: Self-pay | Admitting: Cardiovascular Disease

## 2024-05-04 DIAGNOSIS — I482 Chronic atrial fibrillation, unspecified: Secondary | ICD-10-CM

## 2024-05-04 DIAGNOSIS — I4821 Permanent atrial fibrillation: Secondary | ICD-10-CM

## 2024-05-16 ENCOUNTER — Encounter: Payer: Self-pay | Admitting: Cardiovascular Disease

## 2024-05-16 NOTE — Telephone Encounter (Signed)
 Error

## 2024-05-18 ENCOUNTER — Ambulatory Visit: Attending: Cardiology | Admitting: Cardiology

## 2024-05-18 ENCOUNTER — Encounter: Payer: Self-pay | Admitting: Cardiology

## 2024-05-18 VITALS — BP 114/68 | HR 62 | Ht 72.0 in | Wt 238.2 lb

## 2024-05-18 DIAGNOSIS — I4821 Permanent atrial fibrillation: Secondary | ICD-10-CM

## 2024-05-18 DIAGNOSIS — E7800 Pure hypercholesterolemia, unspecified: Secondary | ICD-10-CM | POA: Diagnosis not present

## 2024-05-18 DIAGNOSIS — I10 Essential (primary) hypertension: Secondary | ICD-10-CM

## 2024-05-18 DIAGNOSIS — R7303 Prediabetes: Secondary | ICD-10-CM

## 2024-05-18 NOTE — Patient Instructions (Signed)

## 2024-05-18 NOTE — Progress Notes (Signed)
 Cardiology Office Note:  .   Date:  05/18/2024  ID:  Cody Whitney, DOB 1951-03-18, MRN [id number] PCP: Marvene Prentice SAUNDERS, FNP  [facility] Providers Cardiologist:  Gordy Bergamo, MD   History of Present Illness: .   Cody Whitney is a 73 y.o. with hypertension, hypercholesterolemia, hyperglycemia, permanent atrial fibrillation presently on Eliquis  5 mg p.o. twice daily presents for annual visit.  Previously followed by Dr. Aleene Passe.  Discussed the use of AI scribe software for clinical note transcription with the patient, who gave verbal consent to proceed.  History of Present Illness Cody Whitney is a 73 year old male who presents for a routine annual visit. He has atrial fibrillation and is on Eliquis , with stable management. An echocardiogram in 2022 showed normal heart function and no valve issues.  Hypertension is well controlled with metoprolol  succinate 25 mg daily. He experiences no chest discomfort, dyspnea, or peripheral edema. Cholesterol is managed with atorvastatin  20 mg daily, with good control.  He has prediabetes with an A1c of 6.3 and a BMI of 32. He does not have a regular exercise routine.  Labs    External Labs:  PCP labs 10/04/2023 on Care Everywhere:  A1c 6.3%.  Hb 14.4/HCT 42.0, platelets 240, normal indicis.  BUN 18, creatinine is 1.08, EGFR 73 mL, potassium 4.9, sodium 142, LFTs normal.  Total cholesterol 126, triglycerides 71, HDL 44, LDL 68.  ROS  Review of Systems  Cardiovascular:  Negative for chest pain, dyspnea on exertion and leg swelling.   Physical Exam:   VS:  BP 114/68 (BP Location: Left Arm, Patient Position: Sitting)   Pulse 62   Ht 6' (1.829 m)   Wt 238 lb 3.2 oz (108 kg)   SpO2 96%   BMI 32.31 kg/m    Wt Readings from Last 3 Encounters:  05/18/24 238 lb 3.2 oz (108 kg)  04/19/23 239 lb 9.6 oz (108.7 kg)  05/13/22 231 lb 2 oz (104.8 kg)    Physical Exam Neck:     Vascular: No carotid bruit or JVD.   Cardiovascular:     Rate and Rhythm: Normal rate. Rhythm irregular.     Pulses: Normal pulses and intact distal pulses.     Heart sounds: No murmur heard. Pulmonary:     Effort: Pulmonary effort is normal.     Breath sounds: Normal breath sounds.  Abdominal:     General: Bowel sounds are normal.     Palpations: Abdomen is soft.  Musculoskeletal:     Right lower leg: No edema.     Left lower leg: No edema.  Skin:    Capillary Refill: Capillary refill takes less than 2 seconds.    Studies Reviewed: .    Echocardiogram 03/11/2021: Normal LV systolic function, mild biatrial enlargement.  No significant valvular abnormality. EKG:    EKG Interpretation Date/Time:  Friday May 18 2024 14:51:46 EDT Ventricular Rate:  62 PR Interval:    QRS Duration:  74 QT Interval:  392 QTC Calculation: 397 R Axis:   -7  Text Interpretation: EKG 05/18/2024: Atrial fibrillation with controlled ventricular response at the rate of 62 bpm otherwise normal EKG.  No significant change from 04/19/2023. Confirmed by Tabatha Razzano, Jagadeesh ([id number]) on 05/18/2024 3:07:44 PM    Medications ordered    No orders of the defined types were placed in this encounter.    ASSESSMENT AND PLAN: .      ICD-10-CM   1. Permanent atrial fibrillation (  HCC)  I48.21 EKG 12-Lead    2. Essential hypertension  I10 EKG 12-Lead    3. Pure hypercholesterolemia  E78.00     4. Prediabetes  R73.03      Assessment & Plan Permanent atrial fibrillation Long-standing permanent atrial fibrillation with normal heart function and asymptomatic status. EKG shows no evidence of ischemia except for AFib. Rate is well controlled with current medication regimen. - Continue Eliquis  for anticoagulation. - Continue metoprolol  succinate 25 mg once daily for rate control.  Hypertension Hypertension is well controlled with current medication regimen. - Continue metoprolol  succinate 25 mg once daily.  Hyperlipidemia Hyperlipidemia is well  controlled with atorvastatin  therapy. - Continue atorvastatin  20 mg once daily.  Prediabetes Prediabetes with A1c at 6.3, indicating a prediabetic level. Emphasis on lifestyle modification to prevent progression to diabetes. - Encourage daily walking and weight loss to reduce BMI to less than 30.  Obesity Obesity with a BMI of 32. Advised to reduce weight to improve overall health and reduce future health risks. Discussed the importance of weight loss to facilitate better outcomes in case of future health issues such as hip replacement or falls. - Encourage daily walking and weight loss to reduce BMI to less than 30.  As patient has remained stable and has chronic/permanent atrial fibrillation with no effect on his symptoms or LV function, I will see him back on a as needed basis.  Will request PCP to take over Rx as well.  Signed,  Gordy Bergamo, MD, [facility] 05/18/2024, 3:19 PM [facility] [address] [place], [place] [address] Phone: [phone]. Fax:  [phone]

## 2024-08-06 ENCOUNTER — Other Ambulatory Visit: Payer: Self-pay

## 2024-08-06 DIAGNOSIS — I482 Chronic atrial fibrillation, unspecified: Secondary | ICD-10-CM

## 2024-08-06 DIAGNOSIS — I4821 Permanent atrial fibrillation: Secondary | ICD-10-CM

## 2024-08-08 MED ORDER — METOPROLOL SUCCINATE ER 25 MG PO TB24: 25.0000 mg | ORAL_TABLET | Freq: Every day | ORAL | 2 refills | Status: AC
